# Patient Record
Sex: Female | Born: 1955 | Hispanic: No | Marital: Married | Smoking: Never smoker
Health system: Southern US, Community
[De-identification: ages and names within clinical notes are randomized; demographics above are authoritative.]

## PROBLEM LIST (undated history)

## (undated) DIAGNOSIS — F32A Depression, unspecified: Secondary | ICD-10-CM

## (undated) DIAGNOSIS — K52831 Collagenous colitis: Secondary | ICD-10-CM

## (undated) DIAGNOSIS — K589 Irritable bowel syndrome without diarrhea: Secondary | ICD-10-CM

## (undated) DIAGNOSIS — F329 Major depressive disorder, single episode, unspecified: Secondary | ICD-10-CM

## (undated) HISTORY — DX: Depression, unspecified: F32.A

## (undated) HISTORY — DX: Irritable bowel syndrome, unspecified: K58.9

## (undated) HISTORY — DX: Collagenous colitis: K52.831

## (undated) HISTORY — DX: Major depressive disorder, single episode, unspecified: F32.9

---

## 2002-12-21 ENCOUNTER — Other Ambulatory Visit: Admission: RE | Admit: 2002-12-21 | Discharge: 2002-12-21 | Payer: Self-pay | Admitting: Family Medicine

## 2003-02-13 ENCOUNTER — Encounter: Admission: RE | Admit: 2003-02-13 | Discharge: 2003-02-13 | Payer: Self-pay | Admitting: Family Medicine

## 2004-01-03 ENCOUNTER — Other Ambulatory Visit: Admission: RE | Admit: 2004-01-03 | Discharge: 2004-01-03 | Payer: Self-pay | Admitting: Family Medicine

## 2004-03-12 ENCOUNTER — Encounter: Admission: RE | Admit: 2004-03-12 | Discharge: 2004-03-12 | Payer: Self-pay | Admitting: Family Medicine

## 2004-12-29 ENCOUNTER — Ambulatory Visit: Payer: Self-pay | Admitting: Family Medicine

## 2005-01-05 ENCOUNTER — Encounter: Payer: Self-pay | Admitting: Family Medicine

## 2005-01-05 ENCOUNTER — Ambulatory Visit: Payer: Self-pay | Admitting: Family Medicine

## 2005-01-05 ENCOUNTER — Other Ambulatory Visit: Admission: RE | Admit: 2005-01-05 | Discharge: 2005-01-05 | Payer: Self-pay | Admitting: Family Medicine

## 2005-02-09 ENCOUNTER — Ambulatory Visit: Payer: Self-pay | Admitting: Family Medicine

## 2005-04-09 ENCOUNTER — Ambulatory Visit: Payer: Self-pay | Admitting: Family Medicine

## 2006-03-10 ENCOUNTER — Ambulatory Visit: Payer: Self-pay | Admitting: Family Medicine

## 2006-03-10 LAB — CONVERTED CEMR LAB
ALT: 31 units/L (ref 0–40)
AST: 34 units/L (ref 0–37)
Albumin: 3.2 g/dL — ABNORMAL LOW (ref 3.5–5.2)
Alkaline Phosphatase: 46 units/L (ref 39–117)
BUN: 10 mg/dL (ref 6–23)
Basophils Absolute: 0 10*3/uL (ref 0.0–0.1)
Basophils Relative: 0.2 % (ref 0.0–1.0)
CO2: 26 meq/L (ref 19–32)
Calcium: 9.2 mg/dL (ref 8.4–10.5)
Chloride: 108 meq/L (ref 96–112)
Chol/HDL Ratio, serum: 3.1
Cholesterol: 186 mg/dL (ref 0–200)
Creatinine, Ser: 1 mg/dL (ref 0.4–1.2)
Eosinophil percent: 1.3 % (ref 0.0–5.0)
GFR calc non Af Amer: 63 mL/min
Glomerular Filtration Rate, Af Am: 76 mL/min/{1.73_m2}
Glucose, Bld: 95 mg/dL (ref 70–99)
HCT: 36.1 % (ref 36.0–46.0)
HDL: 60.8 mg/dL (ref >39.0)
Hemoglobin: 11.8 g/dL — ABNORMAL LOW (ref 12.0–15.0)
LDL Cholesterol: 97 mg/dL (ref 0–99)
Lymphocytes Relative: 42 % (ref 12.0–46.0)
MCHC: 32.8 g/dL (ref 30.0–36.0)
MCV: 98.4 fL (ref 78.0–100.0)
Monocytes Absolute: 0.5 10*3/uL (ref 0.2–0.7)
Monocytes Relative: 6.3 % (ref 3.0–11.0)
Neutro Abs: 3.6 10*3/uL (ref 1.4–7.7)
Neutrophils Relative %: 50.2 % (ref 43.0–77.0)
Platelets: 311 10*3/uL (ref 150–400)
Potassium: 6.1 meq/L (ref 3.5–5.1)
RBC: 3.67 M/uL — ABNORMAL LOW (ref 3.87–5.11)
RDW: 12.5 % (ref 11.5–14.6)
Sodium: 141 meq/L (ref 135–145)
TSH: 1.53 microintl units/mL (ref 0.35–5.50)
Total Bilirubin: 0.9 mg/dL (ref 0.3–1.2)
Total Protein: 6.5 g/dL (ref 6.0–8.3)
Triglyceride fasting, serum: 143 mg/dL (ref 0–149)
VLDL: 29 mg/dL (ref 0–40)
WBC: 7.2 10*3/uL (ref 4.5–10.5)

## 2006-03-16 ENCOUNTER — Encounter: Payer: Self-pay | Admitting: Family Medicine

## 2006-03-16 ENCOUNTER — Ambulatory Visit: Payer: Self-pay | Admitting: Family Medicine

## 2006-03-16 ENCOUNTER — Other Ambulatory Visit: Admission: RE | Admit: 2006-03-16 | Discharge: 2006-03-16 | Payer: Self-pay | Admitting: Family Medicine

## 2006-04-14 ENCOUNTER — Encounter: Admission: RE | Admit: 2006-04-14 | Discharge: 2006-04-14 | Payer: Self-pay | Admitting: Family Medicine

## 2006-04-29 ENCOUNTER — Encounter: Admission: RE | Admit: 2006-04-29 | Discharge: 2006-04-29 | Payer: Self-pay | Admitting: Family Medicine

## 2006-10-12 ENCOUNTER — Encounter: Admission: RE | Admit: 2006-10-12 | Discharge: 2006-10-12 | Payer: Self-pay | Admitting: Family Medicine

## 2007-04-18 ENCOUNTER — Encounter: Admission: RE | Admit: 2007-04-18 | Discharge: 2007-04-18 | Payer: Self-pay | Admitting: Family Medicine

## 2007-05-12 ENCOUNTER — Telehealth: Payer: Self-pay | Admitting: Family Medicine

## 2007-05-24 ENCOUNTER — Ambulatory Visit: Payer: Self-pay | Admitting: Family Medicine

## 2007-05-24 LAB — CONVERTED CEMR LAB
ALT: 28 units/L (ref 0–35)
AST: 27 units/L (ref 0–37)
Albumin: 3.8 g/dL (ref 3.5–5.2)
Alkaline Phosphatase: 74 units/L (ref 39–117)
BUN: 11 mg/dL (ref 6–23)
Basophils Absolute: 0.1 10*3/uL (ref 0.0–0.1)
Basophils Relative: 1.1 % — ABNORMAL HIGH (ref 0.0–1.0)
Bilirubin Urine: NEGATIVE
Bilirubin, Direct: 0.1 mg/dL (ref 0.0–0.3)
CO2: 32 meq/L (ref 19–32)
Calcium: 9.1 mg/dL (ref 8.4–10.5)
Chloride: 106 meq/L (ref 96–112)
Cholesterol: 193 mg/dL (ref 0–200)
Creatinine, Ser: 0.8 mg/dL (ref 0.4–1.2)
Eosinophils Absolute: 0 10*3/uL (ref 0.0–0.6)
Eosinophils Relative: 0.8 % (ref 0.0–5.0)
GFR calc Af Amer: 98 mL/min
GFR calc non Af Amer: 81 mL/min
Glucose, Bld: 92 mg/dL (ref 70–99)
Glucose, Urine, Semiquant: NEGATIVE
HCT: 36.5 % (ref 36.0–46.0)
HDL: 69.7 mg/dL (ref >39.0)
Hemoglobin: 12.2 g/dL (ref 12.0–15.0)
Ketones, urine, test strip: NEGATIVE
LDL Cholesterol: 103 mg/dL — ABNORMAL HIGH (ref 0–99)
Lymphocytes Relative: 36.8 % (ref 12.0–46.0)
MCHC: 33.5 g/dL (ref 30.0–36.0)
MCV: 100.4 fL — ABNORMAL HIGH (ref 78.0–100.0)
Monocytes Absolute: 0.5 10*3/uL (ref 0.2–0.7)
Monocytes Relative: 8.5 % (ref 3.0–11.0)
Neutro Abs: 2.7 10*3/uL (ref 1.4–7.7)
Neutrophils Relative %: 52.8 % (ref 43.0–77.0)
Nitrite: NEGATIVE
Platelets: 260 10*3/uL (ref 150–400)
Potassium: 5 meq/L (ref 3.5–5.1)
Protein, U semiquant: NEGATIVE
RBC: 3.64 M/uL — ABNORMAL LOW (ref 3.87–5.11)
RDW: 13.2 % (ref 11.5–14.6)
Sodium: 143 meq/L (ref 135–145)
Specific Gravity, Urine: 1.015
TSH: 0.92 microintl units/mL (ref 0.35–5.50)
Total Bilirubin: 0.7 mg/dL (ref 0.3–1.2)
Total CHOL/HDL Ratio: 2.8
Total Protein: 6.5 g/dL (ref 6.0–8.3)
Triglycerides: 104 mg/dL (ref 0–149)
Urobilinogen, UA: 0.2
VLDL: 21 mg/dL (ref 0–40)
WBC Urine, dipstick: NEGATIVE
WBC: 5.3 10*3/uL (ref 4.5–10.5)
pH: 8

## 2007-05-31 ENCOUNTER — Other Ambulatory Visit: Admission: RE | Admit: 2007-05-31 | Discharge: 2007-05-31 | Payer: Self-pay | Admitting: Family Medicine

## 2007-05-31 ENCOUNTER — Encounter: Payer: Self-pay | Admitting: Family Medicine

## 2007-05-31 ENCOUNTER — Ambulatory Visit: Payer: Self-pay | Admitting: Family Medicine

## 2007-06-01 DIAGNOSIS — F329 Major depressive disorder, single episode, unspecified: Secondary | ICD-10-CM

## 2007-06-01 DIAGNOSIS — F32A Depression, unspecified: Secondary | ICD-10-CM | POA: Insufficient documentation

## 2007-06-09 ENCOUNTER — Ambulatory Visit: Payer: Self-pay | Admitting: Gastroenterology

## 2007-06-22 ENCOUNTER — Encounter: Payer: Self-pay | Admitting: Family Medicine

## 2007-06-22 ENCOUNTER — Ambulatory Visit: Payer: Self-pay | Admitting: Gastroenterology

## 2007-06-23 ENCOUNTER — Telehealth: Payer: Self-pay | Admitting: Family Medicine

## 2007-08-03 DIAGNOSIS — J309 Allergic rhinitis, unspecified: Secondary | ICD-10-CM | POA: Insufficient documentation

## 2007-08-09 ENCOUNTER — Ambulatory Visit: Payer: Self-pay | Admitting: Family Medicine

## 2007-10-05 ENCOUNTER — Ambulatory Visit: Payer: Self-pay | Admitting: Internal Medicine

## 2007-10-05 DIAGNOSIS — M65849 Other synovitis and tenosynovitis, unspecified hand: Secondary | ICD-10-CM

## 2007-10-05 DIAGNOSIS — M65839 Other synovitis and tenosynovitis, unspecified forearm: Secondary | ICD-10-CM

## 2008-05-03 ENCOUNTER — Telehealth: Payer: Self-pay | Admitting: Family Medicine

## 2008-07-16 ENCOUNTER — Ambulatory Visit: Payer: Self-pay | Admitting: Family Medicine

## 2008-07-16 LAB — CONVERTED CEMR LAB
ALT: 29 units/L (ref 0–35)
AST: 29 units/L (ref 0–37)
Albumin: 3.6 g/dL (ref 3.5–5.2)
Alkaline Phosphatase: 62 units/L (ref 39–117)
BUN: 13 mg/dL (ref 6–23)
Basophils Absolute: 0 10*3/uL (ref 0.0–0.1)
Basophils Relative: 0.7 % (ref 0.0–3.0)
Bilirubin Urine: NEGATIVE
Bilirubin, Direct: 0.1 mg/dL (ref 0.0–0.3)
CO2: 28 meq/L (ref 19–32)
Calcium: 8.7 mg/dL (ref 8.4–10.5)
Chloride: 109 meq/L (ref 96–112)
Cholesterol: 194 mg/dL (ref 0–200)
Creatinine, Ser: 0.7 mg/dL (ref 0.4–1.2)
Eosinophils Absolute: 0.8 10*3/uL — ABNORMAL HIGH (ref 0.0–0.7)
Eosinophils Relative: 12.7 % — ABNORMAL HIGH (ref 0.0–5.0)
GFR calc non Af Amer: 93.45 mL/min (ref >60)
Glucose, Bld: 82 mg/dL (ref 70–99)
Glucose, Urine, Semiquant: NEGATIVE
HCT: 37.6 % (ref 36.0–46.0)
HDL: 72.1 mg/dL (ref >39.00)
Hemoglobin: 12.9 g/dL (ref 12.0–15.0)
Ketones, urine, test strip: NEGATIVE
LDL Cholesterol: 109 mg/dL — ABNORMAL HIGH (ref 0–99)
Lymphocytes Relative: 30.1 % (ref 12.0–46.0)
Lymphs Abs: 1.8 10*3/uL (ref 0.7–4.0)
MCHC: 34.4 g/dL (ref 30.0–36.0)
MCV: 101.2 fL — ABNORMAL HIGH (ref 78.0–100.0)
Monocytes Absolute: 0.4 10*3/uL (ref 0.1–1.0)
Monocytes Relative: 7.1 % (ref 3.0–12.0)
Neutro Abs: 3.1 10*3/uL (ref 1.4–7.7)
Neutrophils Relative %: 49.4 % (ref 43.0–77.0)
Nitrite: NEGATIVE
Platelets: 214 10*3/uL (ref 150.0–400.0)
Potassium: 3.9 meq/L (ref 3.5–5.1)
Protein, U semiquant: NEGATIVE
RBC: 3.72 M/uL — ABNORMAL LOW (ref 3.87–5.11)
RDW: 13.3 % (ref 11.5–14.6)
Sodium: 143 meq/L (ref 135–145)
Specific Gravity, Urine: 1.015
TSH: 1.25 microintl units/mL (ref 0.35–5.50)
Total Bilirubin: 0.6 mg/dL (ref 0.3–1.2)
Total CHOL/HDL Ratio: 3
Total Protein: 6.4 g/dL (ref 6.0–8.3)
Triglycerides: 64 mg/dL (ref 0.0–149.0)
Urobilinogen, UA: 0.2
VLDL: 12.8 mg/dL (ref 0.0–40.0)
WBC Urine, dipstick: NEGATIVE
WBC: 6.1 10*3/uL (ref 4.5–10.5)
pH: 7.5

## 2008-07-25 ENCOUNTER — Ambulatory Visit: Payer: Self-pay | Admitting: Family Medicine

## 2008-12-28 ENCOUNTER — Telehealth: Payer: Self-pay | Admitting: Family Medicine

## 2009-01-02 ENCOUNTER — Ambulatory Visit: Payer: Self-pay | Admitting: Family Medicine

## 2009-01-02 DIAGNOSIS — L02419 Cutaneous abscess of limb, unspecified: Secondary | ICD-10-CM | POA: Insufficient documentation

## 2009-01-02 DIAGNOSIS — L03119 Cellulitis of unspecified part of limb: Secondary | ICD-10-CM

## 2009-07-19 ENCOUNTER — Ambulatory Visit: Payer: Self-pay | Admitting: Family Medicine

## 2009-07-19 LAB — CONVERTED CEMR LAB
ALT: 32 units/L (ref 0–35)
AST: 43 units/L — ABNORMAL HIGH (ref 0–37)
Albumin: 4.2 g/dL (ref 3.5–5.2)
Alkaline Phosphatase: 76 units/L (ref 39–117)
BUN: 13 mg/dL (ref 6–23)
Basophils Absolute: 0 10*3/uL (ref 0.0–0.1)
Basophils Relative: 0.5 % (ref 0.0–3.0)
Bilirubin Urine: NEGATIVE
Bilirubin, Direct: 0.1 mg/dL (ref 0.0–0.3)
Blood in Urine, dipstick: NEGATIVE
CO2: 25 meq/L (ref 19–32)
Calcium: 9.3 mg/dL (ref 8.4–10.5)
Chloride: 105 meq/L (ref 96–112)
Cholesterol: 207 mg/dL — ABNORMAL HIGH (ref 0–200)
Creatinine, Ser: 0.8 mg/dL (ref 0.4–1.2)
Direct LDL: 116.1 mg/dL
Eosinophils Absolute: 0.1 10*3/uL (ref 0.0–0.7)
Eosinophils Relative: 1.6 % (ref 0.0–5.0)
GFR calc non Af Amer: 80.96 mL/min (ref >60)
Glucose, Bld: 82 mg/dL (ref 70–99)
Glucose, Urine, Semiquant: NEGATIVE
HCT: 35.3 % — ABNORMAL LOW (ref 36.0–46.0)
HDL: 77.1 mg/dL (ref >39.00)
Hemoglobin: 12.1 g/dL (ref 12.0–15.0)
Ketones, urine, test strip: NEGATIVE
Lymphocytes Relative: 48.4 % — ABNORMAL HIGH (ref 12.0–46.0)
Lymphs Abs: 2.2 10*3/uL (ref 0.7–4.0)
MCHC: 34.5 g/dL (ref 30.0–36.0)
MCV: 101.6 fL — ABNORMAL HIGH (ref 78.0–100.0)
Monocytes Absolute: 0.4 10*3/uL (ref 0.1–1.0)
Monocytes Relative: 8.9 % (ref 3.0–12.0)
Neutro Abs: 1.9 10*3/uL (ref 1.4–7.7)
Neutrophils Relative %: 40.6 % — ABNORMAL LOW (ref 43.0–77.0)
Nitrite: NEGATIVE
Platelets: 221 10*3/uL (ref 150.0–400.0)
Potassium: 3.9 meq/L (ref 3.5–5.1)
Protein, U semiquant: NEGATIVE
RBC: 3.47 M/uL — ABNORMAL LOW (ref 3.87–5.11)
RDW: 13.4 % (ref 11.5–14.6)
Sodium: 140 meq/L (ref 135–145)
Specific Gravity, Urine: 1.015
TSH: 1.68 microintl units/mL (ref 0.35–5.50)
Total Bilirubin: 0.8 mg/dL (ref 0.3–1.2)
Total CHOL/HDL Ratio: 3
Total Protein: 6.8 g/dL (ref 6.0–8.3)
Triglycerides: 69 mg/dL (ref 0.0–149.0)
Urobilinogen, UA: 0.2
VLDL: 13.8 mg/dL (ref 0.0–40.0)
WBC: 4.6 10*3/uL (ref 4.5–10.5)
pH: 5.5

## 2009-07-25 ENCOUNTER — Other Ambulatory Visit: Admission: RE | Admit: 2009-07-25 | Discharge: 2009-07-25 | Payer: Self-pay | Admitting: Family Medicine

## 2009-07-25 ENCOUNTER — Ambulatory Visit: Payer: Self-pay | Admitting: Family Medicine

## 2009-07-25 DIAGNOSIS — N951 Menopausal and female climacteric states: Secondary | ICD-10-CM

## 2009-07-25 LAB — HM PAP SMEAR

## 2009-07-31 ENCOUNTER — Encounter: Admission: RE | Admit: 2009-07-31 | Discharge: 2009-07-31 | Payer: Self-pay | Admitting: Family Medicine

## 2009-07-31 LAB — HM MAMMOGRAPHY

## 2009-08-23 ENCOUNTER — Telehealth: Payer: Self-pay | Admitting: Family Medicine

## 2010-02-05 ENCOUNTER — Encounter: Payer: Self-pay | Admitting: Family Medicine

## 2010-03-30 ENCOUNTER — Encounter: Payer: Self-pay | Admitting: Family Medicine

## 2010-04-06 LAB — CONVERTED CEMR LAB: Pap Smear: NEGATIVE

## 2010-04-08 NOTE — Medication Information (Signed)
Summary: Prior Authorization and Approval for Zolpidem Tartrate  Prior Authorization and Approval for Zolpidem Tartrate   Imported By: Maryln Gottron 02/07/2010 15:11:17  _____________________________________________________________________  External Attachment:    Type:   Image     Comment:   External Document

## 2010-04-08 NOTE — Progress Notes (Signed)
Summary: Hormone problems    Caller: Patient Call For: Roderick Pee MD Summary of Call: Pt is getting full periods on Prempro.  It has helped with menopausal symptoms, and has gained 5 lbs.  Wants to know if this is normal to have full blown periods. [phone] Initial call taken by: Lynann Beaver CMA,  August 23, 2009 11:19 AM    Additional Follow-up for Phone Call Additional follow up Details #2::    disinsert 29, standing on the Prempro she feels much better, but now is spotting.  Advised to take one pill twice a day for two days, then go back to one a day.  Call p.r.n. Follow-up by: Roderick Pee MD,  August 23, 2009 1:43 PM

## 2010-04-08 NOTE — Assessment & Plan Note (Signed)
Summary: cpx/pap/njr rsc bmp/njr   Vital Signs:  Patient profile:   55 year old female Menstrual status:  regular LMP:     02/06/2009 Height:      68 inches Weight:      160 pounds BMI:     24.42 Temp:     98.2 degrees F oral BP sitting:   108 / 68  (left arm) Cuff size:   regular  Vitals Entered By: Kern Reap CMA Duncan Dull) (Jul 25, 2009 9:06 AM) CC: cpx LMP (date): 02/06/2009     Enter LMP: 02/06/2009   CC:  cpx.  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  Because of working, nights she's having difficulty sleeping.  We will discuss options.  Her LMP was December since that time.  She is having trouble with hot flashes dry.  Skin dry vagina sleep dysfunction.  She is taking birth control pills in the past with no side effects.  Tetanus 2006 seasonal flu 2010  Allergies: No Known Drug Allergies  Past History:  Past medical, surgical, family and social histories (including risk factors) reviewed, and no changes noted (except as noted below).  Past Medical History: Reviewed history from 05/31/2007 and no changes required. cb x1  Depression IBS  Family History: Reviewed history from 05/31/2007 and no changes required. father died at 44 an MRI he had bypass at age 32.  He was a smoker mother 9 had degenerative joint disease and hypertension otherwise in good health one brother in good health.  A sister with lupus.  Originally from [place], [place].  Social History: Reviewed history from 07/25/2008 and no changes required. Occupation Water engineer, [employer] Never Smoked Alcohol use-no Drug use-no Regular exercise-yes  Review of Systems      See  HPI  Physical Exam  General:  Well-developed,well-nourished,in no acute distress; alert,appropriate and cooperative throughout examination Head:  Normocephalic and atraumatic without obvious abnormalities. No apparent alopecia or balding. Eyes:  No corneal or conjunctival inflammation noted. EOMI. Perrla. Funduscopic exam benign, without hemorrhages, exudates or papilledema. Vision grossly normal. Ears:  External ear exam shows no significant lesions or deformities.  Otoscopic examination reveals clear canals, tympanic membranes are intact bilaterally without bulging, retraction, inflammation or discharge. Hearing is grossly normal bilaterally. Nose:  External nasal examination shows no deformity or inflammation. Nasal mucosa are pink and moist without lesions or exudates. Mouth:  Oral mucosa and oropharynx without lesions or exudates.  Teeth in good repair. Neck:  No deformities, masses, or tenderness noted. Chest Wall:  No deformities, masses, or tenderness noted. Breasts:  diffuse fibrocystic changes Lungs:  Normal respiratory effort, chest expands symmetrically. Lungs are clear to auscultation, no crackles or wheezes. Heart:  Normal rate and regular rhythm. S1 and S2 normal without gallop, murmur, click, rub or other extra sounds. Abdomen:  Bowel sounds positive,abdomen soft and non-tender without masses, organomegaly or hernias noted. Rectal:  No external abnormalities noted. Normal sphincter tone. No rectal masses or tenderness. Genitalia:  Pelvic Exam:        External: normal female genitalia without lesions or masses        Vagina: normal without lesions or masses  Cervix: normal without lesions or masses        Adnexa: normal bimanual exam without masses or fullness        Uterus: normal by palpation        Pap smear: performed Msk:  No deformity or scoliosis noted of thoracic or lumbar spine.   Pulses:  R and L carotid,radial,femoral,dorsalis pedis and posterior tibial pulses  are full and equal bilaterally Extremities:  No clubbing, cyanosis, edema, or deformity noted with normal full range of motion of all joints.   Neurologic:  No cranial nerve deficits noted. Station and gait are normal. Plantar reflexes are down-going bilaterally. DTRs are symmetrical throughout. Sensory, motor and coordinative functions appear intact. Skin:  Intact without suspicious lesions or rashes Cervical Nodes:  No lymphadenopathy noted Axillary Nodes:  No palpable lymphadenopathy Inguinal Nodes:  No significant adenopathy Psych:  Cognition and judgment appear intact. Alert and cooperative with normal attention span and concentration. No apparent delusions, illusions, hallucinations   Impression & Recommendations:  Problem # 1:  HEALTH MAINTENANCE EXAM (ICD-V70.0) Assessment Unchanged  Orders: Prescription Created Electronically [id number]) EKG w/ Interpretation (93000)  Problem # 2:  DEPRESSION (ICD-311) Assessment: Improved  Her updated medication list for this problem includes:    Citalopram Hydrobromide 20 Mg Tabs (Citalopram hydrobromide) ..... One and 1/2  by mouth q hs  Orders: Prescription Created Electronically [phone])  Problem # 3:  MENOPAUSE-RELATED VASOMOTOR SYMPTOMS, HOT FLASHES (ICD-627.2) Assessment: New  Her updated medication list for this problem includes:    Prempro 0.3-1.5 Mg Tabs (Conj estrog-medroxyprogest ace) .Marland Kitchen... 1 tab @ bedtime  Orders: Prescription Created Electronically [id number])  Complete Medication List: 1)  Citalopram Hydrobromide 20 Mg Tabs (Citalopram hydrobromide) .... One and 1/2  by mouth q hs 2)  Prempro 0.3-1.5 Mg Tabs (Conj estrog-medroxyprogest ace) .Marland Kitchen.. 1 tab @ bedtime 3)  Ambien 5 Mg Tabs (Zolpidem tartrate) .Marland Kitchen.. 1 tab @ bedtime as needed  Patient Instructions: 1)  take Ambien 5 mg dose one half tablet p.r.n. for sleep .  Also begin Prempro .3, one daily, take one aspirin tablet with it.  If after one month y r  feeling well,   Continue that medication,  if not call. 2)  Please schedule a follow-up appointment in 1 year. 3)  Schedule your mammogram. 4)  Take an Aspirin every day. Prescriptions: PREMPRO 0.3-1.5 MG TABS (CONJ ESTROG-MEDROXYPROGEST ACE) 1 tab @ bedtime  #100 x 3   Entered and Authorized by:   Roderick Pee MD   Signed by:   Roderick Pee MD on 07/25/2009   Method used:   Electronically to        [facility]. [phone]* (retail)       [phone]       [place], [place]  [address]       Ph: [phone]       Fax: [phone]   RxID:   [id number] CITALOPRAM HYDROBROMIDE 20 MG  TABS (CITALOPRAM HYDROBROMIDE) one and 1/2  by mouth q hs  #150 x 3   Entered and Authorized by:   Roderick Pee MD   Signed by:   Roderick Pee MD on 07/25/2009   Method used:   Electronically to        [facility]. [phone]* (retail)       [phone] [address]       [facility]  [place], [place]  [address]       Ph: [phone]       Fax: [phone]   RxID:   [id number] AMBIEN 5 MG TABS (ZOLPIDEM TARTRATE) 1 tab @ bedtime as needed  #30 x 3   Entered and Authorized by:   Roderick Pee MD   Signed by:   Roderick Pee MD on 07/25/2009   Method used:   Print then Give to Patient   RxID:   [id number] PREMPRO 0.3-1.5 MG TABS (CONJ ESTROG-MEDROXYPROGEST ACE) 1 tab @ bedtime  #100 x 3   Entered and Authorized by:   Roderick Pee MD   Signed by:   Roderick Pee MD on 07/25/2009   Method used:   Electronically to        [facility]. [phone]* (retail)       [phone] [address]       [place], [place]  [address]       Ph: [phone]       Fax: [phone]   RxID:   [id number]

## 2010-07-27 ENCOUNTER — Other Ambulatory Visit: Payer: Self-pay | Admitting: Family Medicine

## 2010-08-14 ENCOUNTER — Other Ambulatory Visit: Payer: Self-pay | Admitting: Family Medicine

## 2010-08-21 ENCOUNTER — Other Ambulatory Visit: Payer: Self-pay | Admitting: Family Medicine

## 2010-08-21 DIAGNOSIS — Z1231 Encounter for screening mammogram for malignant neoplasm of breast: Secondary | ICD-10-CM

## 2010-08-28 ENCOUNTER — Ambulatory Visit
Admission: RE | Admit: 2010-08-28 | Discharge: 2010-08-28 | Disposition: A | Discharge status: Home / Self Care | Payer: 59 | Source: Ambulatory Visit | Attending: Family Medicine | Admitting: Family Medicine

## 2010-08-28 DIAGNOSIS — Z1231 Encounter for screening mammogram for malignant neoplasm of breast: Secondary | ICD-10-CM

## 2010-09-03 ENCOUNTER — Other Ambulatory Visit (INDEPENDENT_AMBULATORY_CARE_PROVIDER_SITE_OTHER): Payer: 59

## 2010-09-03 DIAGNOSIS — Z0000 Encounter for general adult medical examination without abnormal findings: Secondary | ICD-10-CM

## 2010-09-03 LAB — BASIC METABOLIC PANEL
BUN: 14 mg/dL (ref 6–23)
CO2: 30 mEq/L (ref 19–32)
Calcium: 8.8 mg/dL (ref 8.4–10.5)
Chloride: 108 mEq/L (ref 96–112)
Creatinine, Ser: 0.8 mg/dL (ref 0.4–1.2)
GFR: 84.3 mL/min (ref >60.00)
Glucose, Bld: 89 mg/dL (ref 70–99)
Potassium: 5.2 mEq/L — ABNORMAL HIGH (ref 3.5–5.1)
Sodium: 145 mEq/L (ref 135–145)

## 2010-09-03 LAB — POCT URINALYSIS DIPSTICK
Bilirubin, UA: NEGATIVE
Blood, UA: NEGATIVE
Glucose, UA: NEGATIVE
Ketones, UA: NEGATIVE
Nitrite, UA: POSITIVE
Protein, UA: NEGATIVE
Spec Grav, UA: 1.015
Urobilinogen, UA: 0.2
pH, UA: 7.5

## 2010-09-03 LAB — CBC WITH DIFFERENTIAL/PLATELET
Basophils Absolute: 0 10*3/uL (ref 0.0–0.1)
Basophils Relative: 0.6 % (ref 0.0–3.0)
Eosinophils Absolute: 0.1 10*3/uL (ref 0.0–0.7)
Eosinophils Relative: 1.3 % (ref 0.0–5.0)
HCT: 35.5 % — ABNORMAL LOW (ref 36.0–46.0)
Hemoglobin: 12.1 g/dL (ref 12.0–15.0)
Lymphocytes Relative: 46.3 % — ABNORMAL HIGH (ref 12.0–46.0)
Lymphs Abs: 2.1 10*3/uL (ref 0.7–4.0)
MCHC: 33.9 g/dL (ref 30.0–36.0)
MCV: 101.7 fl — ABNORMAL HIGH (ref 78.0–100.0)
Monocytes Absolute: 0.4 10*3/uL (ref 0.1–1.0)
Monocytes Relative: 8.7 % (ref 3.0–12.0)
Neutro Abs: 2 10*3/uL (ref 1.4–7.7)
Neutrophils Relative %: 43.1 % (ref 43.0–77.0)
Platelets: 225 10*3/uL (ref 150.0–400.0)
RBC: 3.49 Mil/uL — ABNORMAL LOW (ref 3.87–5.11)
RDW: 13.5 % (ref 11.5–14.6)
WBC: 4.6 10*3/uL (ref 4.5–10.5)

## 2010-09-03 LAB — LIPID PANEL
Cholesterol: 184 mg/dL (ref 0–200)
HDL: 76.8 mg/dL (ref >39.00)
LDL Cholesterol: 96 mg/dL (ref 0–99)
Total CHOL/HDL Ratio: 2
Triglycerides: 57 mg/dL (ref 0.0–149.0)
VLDL: 11.4 mg/dL (ref 0.0–40.0)

## 2010-09-03 LAB — HEPATIC FUNCTION PANEL
ALT: 23 U/L (ref 0–35)
AST: 29 U/L (ref 0–37)
Albumin: 3.9 g/dL (ref 3.5–5.2)
Alkaline Phosphatase: 60 U/L (ref 39–117)
Bilirubin, Direct: 0.1 mg/dL (ref 0.0–0.3)
Total Bilirubin: 0.6 mg/dL (ref 0.3–1.2)
Total Protein: 6.3 g/dL (ref 6.0–8.3)

## 2010-09-03 LAB — TSH: TSH: 1.36 u[IU]/mL (ref 0.35–5.50)

## 2010-09-05 ENCOUNTER — Other Ambulatory Visit: Payer: Self-pay | Admitting: Family Medicine

## 2010-09-11 ENCOUNTER — Encounter: Payer: Self-pay | Admitting: Family Medicine

## 2010-09-16 ENCOUNTER — Other Ambulatory Visit (HOSPITAL_COMMUNITY)
Admission: RE | Admit: 2010-09-16 | Discharge: 2010-09-16 | Disposition: A | Discharge status: Home / Self Care | Payer: 59 | Source: Ambulatory Visit | Attending: Family Medicine | Admitting: Family Medicine

## 2010-09-16 ENCOUNTER — Encounter: Payer: Self-pay | Admitting: Family Medicine

## 2010-09-16 ENCOUNTER — Ambulatory Visit (INDEPENDENT_AMBULATORY_CARE_PROVIDER_SITE_OTHER): Payer: 59 | Admitting: Family Medicine

## 2010-09-16 DIAGNOSIS — N951 Menopausal and female climacteric states: Secondary | ICD-10-CM

## 2010-09-16 DIAGNOSIS — F329 Major depressive disorder, single episode, unspecified: Secondary | ICD-10-CM

## 2010-09-16 DIAGNOSIS — Z01419 Encounter for gynecological examination (general) (routine) without abnormal findings: Secondary | ICD-10-CM | POA: Insufficient documentation

## 2010-09-16 MED ORDER — CITALOPRAM HYDROBROMIDE 20 MG PO TABS: ORAL_TABLET | ORAL | Status: DC

## 2010-09-16 MED ORDER — CONJ ESTROG-MEDROXYPROGEST ACE 0.3-1.5 MG PO TABS: 1.0000 | ORAL_TABLET | Freq: Every day | ORAL | Status: DC

## 2010-09-16 MED ORDER — ZOLPIDEM TARTRATE 5 MG PO TABS: 5.0000 mg | ORAL_TABLET | Freq: Every evening | ORAL | Status: DC | PRN

## 2010-09-16 NOTE — Progress Notes (Signed)
  Subjective:    Patient ID: Rebecca Hart, female    DOB: [birth year], 55 y.o.   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  She has postmenopausal symptoms and is on Prempro one daily, asymptomatic on medication.  She also uses Ambien 5 mg p.r.n. Because of sleep dysfunction from her work at the hospital.  She gets routine eye care, dental care, BSE monthly, and you mammography, colonoscopy, normal.  Tetanus 2006.   Review of Systems  Constitutional: Negative.   HENT: Negative.   Eyes: Negative.   Respiratory: Negative.   Cardiovascular: Negative.   Gastrointestinal: Negative.   Genitourinary: Negative.   Musculoskeletal: Negative.   Neurological: Negative.   Hematological: Negative.   Psychiatric/Behavioral: Negative.        Objective:   Physical Exam  Constitutional: She appears well-developed and well-nourished.  HENT:  Head: Normocephalic and atraumatic.  Right Ear: External ear normal.  Left Ear: External ear normal.  Nose: Nose normal.  Mouth/Throat: Oropharynx is clear and moist.  Eyes: EOM are normal. Pupils are equal, round, and reactive to light.  Neck: Normal range of motion. Neck supple. No thyromegaly present.  Cardiovascular: Normal rate, regular rhythm, normal heart sounds and intact distal pulses.  Exam reveals no gallop and no friction rub.   No murmur heard. Pulmonary/Chest: Effort normal and breath sounds normal.  Abdominal: Soft. Bowel sounds are normal. She exhibits no distension and no mass. There is no tenderness. There is no rebound.  Genitourinary: Vagina normal and uterus normal. Guaiac negative stool. No vaginal discharge found.       Bilateral breast exam normal  Musculoskeletal: Normal range of motion.  Lymphadenopathy:    She has  no cervical adenopathy.  Neurological: She is alert. She has normal reflexes. No cranial nerve deficit. She exhibits normal muscle tone. Coordination normal.  Skin: Skin is warm and dry.  Psychiatric: She has a normal mood and affect. Her behavior is normal. Judgment and thought content normal.          Assessment & Plan:  Healthy female.  Mild depression continue Celexa 30 mg daily.  Postmenopausal with symptomatic flushes continue Prempro one daily.  Sleep dysfunction.  Ambien p.r.n.

## 2010-09-16 NOTE — Patient Instructions (Signed)
Continue current medications follow-up in one year, sooner if any problems.  Remember to take calcium and vitamin D on a daily basis and an 81-mg baby aspirin

## 2010-10-17 ENCOUNTER — Other Ambulatory Visit: Payer: Self-pay | Admitting: Family Medicine

## 2010-10-20 ENCOUNTER — Other Ambulatory Visit: Payer: Self-pay | Admitting: *Deleted

## 2010-10-20 DIAGNOSIS — N951 Menopausal and female climacteric states: Secondary | ICD-10-CM

## 2010-10-20 MED ORDER — CONJ ESTROG-MEDROXYPROGEST ACE 0.3-1.5 MG PO TABS: 1.0000 | ORAL_TABLET | Freq: Every day | ORAL | Status: DC

## 2011-03-19 ENCOUNTER — Other Ambulatory Visit: Payer: Self-pay | Admitting: *Deleted

## 2011-03-19 DIAGNOSIS — F329 Major depressive disorder, single episode, unspecified: Secondary | ICD-10-CM

## 2011-03-19 MED ORDER — ZOLPIDEM TARTRATE 5 MG PO TABS: 5.0000 mg | ORAL_TABLET | Freq: Every evening | ORAL | Status: DC | PRN

## 2011-07-06 ENCOUNTER — Encounter: Payer: Self-pay | Admitting: Family

## 2011-07-06 ENCOUNTER — Ambulatory Visit (INDEPENDENT_AMBULATORY_CARE_PROVIDER_SITE_OTHER): Payer: 59 | Admitting: Family

## 2011-07-06 VITALS — BP 98/60 | Temp 98.7°F | Wt 159.0 lb

## 2011-07-06 DIAGNOSIS — J0100 Acute maxillary sinusitis, unspecified: Secondary | ICD-10-CM

## 2011-07-06 DIAGNOSIS — R05 Cough: Secondary | ICD-10-CM

## 2011-07-06 MED ORDER — PREDNISONE 20 MG PO TABS: ORAL_TABLET | ORAL | Status: AC

## 2011-07-06 NOTE — Patient Instructions (Signed)

## 2011-07-06 NOTE — Progress Notes (Signed)
  Subjective:    Patient ID: Rebecca Hart, female    DOB: 1955-04-01, 56 y.o.   MRN: [id number]  HPI 56 year old white female, nonsmoker, patient of Dr. Dora Sims does have complaints of cough that's nonproductive, congestion, sinus pressure and pain, postnasal drip x6 days. She's been taken over-the-counter Zyrtec, Sudafed, Tylenol cold and flu, and NyQuil with no relief. Denies lightheadedness, dizziness, chest pain, palpitations, shortness of breath or edema.   Review of Systems  Constitutional: Negative.   HENT: Positive for congestion, sore throat, rhinorrhea, sneezing, postnasal drip and sinus pressure.   Respiratory: Negative.   Cardiovascular: Negative.   Gastrointestinal: Negative.   Genitourinary: Negative.   Musculoskeletal: Negative.   Skin: Negative.   Neurological: Negative.   Hematological: Negative.   Psychiatric/Behavioral: Negative.    Past Medical History  Diagnosis Date  . Depression   . IBS (irritable bowel syndrome)     History   Social History  . Marital Status: Married    Spouse Name: N/A    Number of Children: N/A  . Years of Education: N/A   Occupational History  . Not on file.   Social History Main Topics  . Smoking status: Never Smoker   . Smokeless tobacco: Not on file  . Alcohol Use: No  . Drug Use: No  . Sexually Active:    Other Topics Concern  . Not on file   Social History Narrative  . No narrative on file    No past surgical history on file.  Family History  Problem Relation Age of Onset  . Hypertension Mother   . Lupus Sister     No Known Allergies  Current Outpatient Prescriptions on File Prior to Visit  Medication Sig Dispense Refill  . citalopram (CELEXA) 20 MG tablet 1 1/2 tabs nightly  150 tablet  3  . estrogen, conjugated,-medroxyprogesterone (PREMPRO) 0.3-1.5 MG per tablet Take 1 tablet by mouth daily.  84 tablet  3  . zolpidem (AMBIEN) 5 MG tablet Take 1 tablet (5 mg total) by mouth at bedtime as needed.  30  tablet  5    BP 98/60  Temp(Src) 98.7 F (37.1 C) (Oral)  Wt 159 lb (72.122 kg)chart    Objective:   Physical Exam  Constitutional: She is oriented to person, place, and time. She appears well-developed and well-nourished.  HENT:  Right Ear: External ear normal.  Left Ear: External ear normal.  Nose: Nose normal.  Mouth/Throat: Oropharynx is clear and moist.  Neck: Normal range of motion. Neck supple.  Cardiovascular: Normal rate, regular rhythm and normal heart sounds.   Pulmonary/Chest: Effort normal and breath sounds normal.  Neurological: She is alert and oriented to person, place, and time.  Skin: Skin is warm and dry.  Psychiatric: She has a normal mood and affect.          Assessment & Plan:  Assessment: Acute sinusitis, allergic rhinitis  Plan: Prednisone 60 mg by mouth q. a.m. x3 days 40 mg by mouth every morning x3 days, 20 mg by mouth every morning x3 days

## 2011-08-04 ENCOUNTER — Encounter: Payer: Self-pay | Admitting: Family Medicine

## 2011-08-04 ENCOUNTER — Ambulatory Visit (INDEPENDENT_AMBULATORY_CARE_PROVIDER_SITE_OTHER): Payer: 59 | Admitting: Family Medicine

## 2011-08-04 ENCOUNTER — Telehealth: Payer: Self-pay | Admitting: Family Medicine

## 2011-08-04 ENCOUNTER — Other Ambulatory Visit (HOSPITAL_COMMUNITY)
Admission: RE | Admit: 2011-08-04 | Discharge: 2011-08-04 | Disposition: A | Discharge status: Home / Self Care | Payer: 59 | Source: Ambulatory Visit | Attending: Family Medicine | Admitting: Family Medicine

## 2011-08-04 VITALS — BP 120/80 | Temp 98.2°F | Wt 160.0 lb

## 2011-08-04 DIAGNOSIS — Z0000 Encounter for general adult medical examination without abnormal findings: Secondary | ICD-10-CM

## 2011-08-04 DIAGNOSIS — N950 Postmenopausal bleeding: Secondary | ICD-10-CM | POA: Insufficient documentation

## 2011-08-04 DIAGNOSIS — Z01419 Encounter for gynecological examination (general) (routine) without abnormal findings: Secondary | ICD-10-CM | POA: Insufficient documentation

## 2011-08-04 DIAGNOSIS — N951 Menopausal and female climacteric states: Secondary | ICD-10-CM

## 2011-08-04 DIAGNOSIS — F329 Major depressive disorder, single episode, unspecified: Secondary | ICD-10-CM

## 2011-08-04 DIAGNOSIS — N946 Dysmenorrhea, unspecified: Secondary | ICD-10-CM

## 2011-08-04 MED ORDER — CONJ ESTROG-MEDROXYPROGEST ACE 0.3-1.5 MG PO TABS: 1.0000 | ORAL_TABLET | Freq: Every day | ORAL | Status: DC

## 2011-08-04 MED ORDER — ZOLPIDEM TARTRATE 5 MG PO TABS: 5.0000 mg | ORAL_TABLET | Freq: Every evening | ORAL | Status: DC | PRN

## 2011-08-04 MED ORDER — CITALOPRAM HYDROBROMIDE 20 MG PO TABS: ORAL_TABLET | ORAL | Status: DC

## 2011-08-04 NOTE — Patient Instructions (Signed)
Take one Prempro tablet twice daily until the bleeding stops then go back and take one daily  Followup in 3 weeks sooner if any problems

## 2011-08-04 NOTE — Progress Notes (Signed)
  Subjective:    Patient ID: Rebecca Hart, female    DOB: October 25, 1955, 56 y.o.   MRN: [id number]  HPI Rebecca Hart is a 56 year old married female nonsmoker who comes in today for evaluation of postmenopausal bleeding  She had her last normal. 2-1/2 years ago. Because of severe hot flashes we put her on Prempro which she has taken the 0.3-1.5 tablet daily. Last Thursday she noticed some spotting. Otherwise asymptomatic. It was around the time of her daughter's graduation from [employer] and she can't remember she did skip a pill or 2  Review of systems otherwise negative   Review of Systems  Constitutional: Negative.   HENT: Negative.   Eyes: Negative.   Respiratory: Negative.   Cardiovascular: Negative.   Gastrointestinal: Negative.   Genitourinary: Negative.   Musculoskeletal: Negative.   Neurological: Negative.   Hematological: Negative.   Psychiatric/Behavioral: Negative.        Objective:   Physical Exam  Constitutional: She appears well-developed and well-nourished.  HENT:  Head: Normocephalic and atraumatic.  Right Ear: External ear normal.  Left Ear: External ear normal.  Nose: Nose normal.  Mouth/Throat: Oropharynx is clear and moist.  Eyes: EOM are normal. Pupils are equal, round, and reactive to light.  Neck: Normal range of motion. Neck supple. No thyromegaly present.  Cardiovascular: Normal rate, regular rhythm, normal heart sounds and intact distal pulses.  Exam reveals no gallop and no friction rub.   No murmur heard. Pulmonary/Chest: Effort normal and breath sounds normal.  Abdominal: Soft. Bowel sounds are normal. She exhibits no distension and no mass. There is no tenderness. There is no rebound.  Genitourinary: Vagina normal and uterus normal. Guaiac negative stool. No vaginal discharge found.  Musculoskeletal: Normal range of motion.  Lymphadenopathy:    She has no cervical adenopathy.  Neurological: She is alert. She has normal reflexes. No cranial nerve  deficit. She exhibits normal muscle tone. Coordination normal.  Skin: Skin is warm and dry.  Psychiatric: She has a normal mood and affect. Her behavior is normal. Judgment and thought content normal.          Assessment & Plan:  Healthy female  Breakthrough bleeding probably secondary to missing a dose or 2 we'll double the dose to get her to stop in in followup to be sure the bleeding stops completely

## 2011-08-04 NOTE — Telephone Encounter (Signed)
Scheduled

## 2011-08-04 NOTE — Telephone Encounter (Signed)
Pt having vaginal bleeding for 6 days and it is not stopping. Pt is already on menopause. Pt req work in ov with Dr Tawanna Cooler.

## 2011-08-19 ENCOUNTER — Other Ambulatory Visit: Payer: Self-pay | Admitting: Family Medicine

## 2011-08-19 DIAGNOSIS — Z1231 Encounter for screening mammogram for malignant neoplasm of breast: Secondary | ICD-10-CM

## 2011-08-26 ENCOUNTER — Encounter: Payer: Self-pay | Admitting: Family Medicine

## 2011-08-26 ENCOUNTER — Ambulatory Visit (INDEPENDENT_AMBULATORY_CARE_PROVIDER_SITE_OTHER): Payer: 59 | Admitting: Family Medicine

## 2011-08-26 VITALS — BP 94/62

## 2011-08-26 DIAGNOSIS — N950 Postmenopausal bleeding: Secondary | ICD-10-CM

## 2011-08-26 NOTE — Progress Notes (Signed)
  Subjective:    Patient ID: Rebecca Hart, female    DOB: [birth year], 56 y.o.   MRN: [id number]  HPI Rebecca Hart is a 56 year old married female nonsmoker who comes in today for followup of postmenopausal breakthrough bleeding.  As noticed in her previous note she had skipped one of her HRT pills had some breakthrough spotting. We therefore doubled her medication the bleeding stopped she would back to one pill a day and now she spotting again. Because of the recurrence I would recommend she have a D&C. I saw that it's not an endometrial cancer but because of the breakthrough bleeding a D&C is indicated.  I explained this to her and she concurs   Review of Systems General and GYN review of systems otherwise negative    Objective:   Physical Exam Well-developed well-nourished female no acute distress       Assessment & Plan:  Postmenopausal bleeding GYN consult with Dr. Arlyce Dice

## 2011-08-26 NOTE — Patient Instructions (Signed)
Continue the medication one daily  We will set you up a consult with Dr. Ilda Mori for evaluation

## 2011-09-02 ENCOUNTER — Ambulatory Visit
Admission: RE | Admit: 2011-09-02 | Discharge: 2011-09-02 | Disposition: A | Discharge status: Home / Self Care | Payer: 59 | Source: Ambulatory Visit | Attending: Family Medicine | Admitting: Family Medicine

## 2011-09-02 DIAGNOSIS — Z1231 Encounter for screening mammogram for malignant neoplasm of breast: Secondary | ICD-10-CM

## 2012-02-11 ENCOUNTER — Other Ambulatory Visit: Payer: Self-pay | Admitting: Family Medicine

## 2012-06-02 ENCOUNTER — Other Ambulatory Visit: Payer: Self-pay | Admitting: Occupational Medicine

## 2012-06-02 ENCOUNTER — Ambulatory Visit: Payer: Self-pay

## 2012-06-02 DIAGNOSIS — R7612 Nonspecific reaction to cell mediated immunity measurement of gamma interferon antigen response without active tuberculosis: Secondary | ICD-10-CM

## 2012-06-17 ENCOUNTER — Encounter: Payer: Self-pay | Admitting: Gastroenterology

## 2012-07-14 ENCOUNTER — Telehealth: Payer: Self-pay | Admitting: Family Medicine

## 2012-07-14 DIAGNOSIS — F329 Major depressive disorder, single episode, unspecified: Secondary | ICD-10-CM

## 2012-07-14 DIAGNOSIS — N951 Menopausal and female climacteric states: Secondary | ICD-10-CM

## 2012-07-14 DIAGNOSIS — F3289 Other specified depressive episodes: Secondary | ICD-10-CM

## 2012-07-14 NOTE — Telephone Encounter (Signed)
Pt cpx is schedules for 09/29/12, but she will be out of medication prior to that date. She needs a refill of the following medications, zolpidem (AMBIEN) 5 MG tablet, estrogen, conjugated,-medroxyprogesterone (PREMPRO) 0.3-1.5 MG per tablet, and citalopram (CELEXA) 20 MG tablet. She would like these sent to the [facility]. Please assist.

## 2012-07-14 NOTE — Telephone Encounter (Signed)
Caller: Debbie/Rebecca Hart; Phone: [phone]; Reason for Call: Rebecca Hart has been taking Prempro for 3 years.  States she and Dr.  Tawanna Cooler agreed to have her take prempro x 3 years, then discuss stopping it.  Has done well on the medication with no problems or side effects.  States she does not have appt with Dr.  Tawanna Cooler until July 2014, and wants to know if she should stop the medication, and if so, how should she reduce it?  RN unable to answer question for the Rebecca Hart; info to office for provider review/callback.  May reach Rebecca Hart at [phone].  Krs/can

## 2012-07-15 MED ORDER — CITALOPRAM HYDROBROMIDE 20 MG PO TABS: ORAL_TABLET | ORAL | Status: DC

## 2012-07-15 MED ORDER — CONJ ESTROG-MEDROXYPROGEST ACE 0.3-1.5 MG PO TABS: 1.0000 | ORAL_TABLET | Freq: Every day | ORAL | Status: DC

## 2012-07-15 MED ORDER — ZOLPIDEM TARTRATE 5 MG PO TABS: 5.0000 mg | ORAL_TABLET | Freq: Every evening | ORAL | Status: DC | PRN

## 2012-07-15 NOTE — Telephone Encounter (Signed)
Spoke with patient and Dr Tawanna Cooler with discuss at next office visit

## 2012-08-11 ENCOUNTER — Other Ambulatory Visit (INDEPENDENT_AMBULATORY_CARE_PROVIDER_SITE_OTHER): Payer: 59

## 2012-08-11 DIAGNOSIS — Z0000 Encounter for general adult medical examination without abnormal findings: Secondary | ICD-10-CM

## 2012-08-11 LAB — CBC WITH DIFFERENTIAL/PLATELET
Eosinophils Absolute: 0.1 10*3/uL (ref 0.0–0.7)
Eosinophils Relative: 1.4 % (ref 0.0–5.0)
HCT: 37.6 % (ref 36.0–46.0)
Lymphs Abs: 2.5 10*3/uL (ref 0.7–4.0)
MCHC: 32.9 g/dL (ref 30.0–36.0)
MCV: 101.6 fl — ABNORMAL HIGH (ref 78.0–100.0)
Monocytes Absolute: 0.5 10*3/uL (ref 0.1–1.0)
Neutrophils Relative %: 44.2 % (ref 43.0–77.0)
Platelets: 264 10*3/uL (ref 150.0–400.0)
WBC: 5.5 10*3/uL (ref 4.5–10.5)

## 2012-08-11 LAB — BASIC METABOLIC PANEL
BUN: 12 mg/dL (ref 6–23)
CO2: 28 mEq/L (ref 19–32)
Chloride: 106 mEq/L (ref 96–112)
Creatinine, Ser: 0.8 mg/dL (ref 0.4–1.2)
Glucose, Bld: 88 mg/dL (ref 70–99)
Potassium: 5.7 mEq/L — ABNORMAL HIGH (ref 3.5–5.1)

## 2012-08-11 LAB — POCT URINALYSIS DIPSTICK
Bilirubin, UA: NEGATIVE
Ketones, UA: NEGATIVE
Nitrite, UA: NEGATIVE
Protein, UA: NEGATIVE
pH, UA: 7.5

## 2012-08-11 LAB — TSH: TSH: 1.96 u[IU]/mL (ref 0.35–5.50)

## 2012-08-11 LAB — LIPID PANEL
Cholesterol: 182 mg/dL (ref 0–200)
Total CHOL/HDL Ratio: 2
Triglycerides: 58 mg/dL (ref 0.0–149.0)

## 2012-08-11 LAB — HEPATIC FUNCTION PANEL
ALT: 24 U/L (ref 0–35)
Bilirubin, Direct: 0 mg/dL (ref 0.0–0.3)
Total Bilirubin: 0.4 mg/dL (ref 0.3–1.2)
Total Protein: 6.6 g/dL (ref 6.0–8.3)

## 2012-08-18 ENCOUNTER — Encounter: Payer: Self-pay | Admitting: Family Medicine

## 2012-08-18 ENCOUNTER — Other Ambulatory Visit (HOSPITAL_COMMUNITY)
Admission: RE | Admit: 2012-08-18 | Discharge: 2012-08-18 | Disposition: A | Discharge status: Home / Self Care | Payer: 59 | Source: Ambulatory Visit | Attending: Family Medicine | Admitting: Family Medicine

## 2012-08-18 ENCOUNTER — Ambulatory Visit (INDEPENDENT_AMBULATORY_CARE_PROVIDER_SITE_OTHER): Payer: 59 | Admitting: Family Medicine

## 2012-08-18 VITALS — BP 110/78 | Temp 98.3°F | Ht 68.25 in | Wt 155.0 lb

## 2012-08-18 DIAGNOSIS — Z01419 Encounter for gynecological examination (general) (routine) without abnormal findings: Secondary | ICD-10-CM | POA: Insufficient documentation

## 2012-08-18 DIAGNOSIS — J309 Allergic rhinitis, unspecified: Secondary | ICD-10-CM

## 2012-08-18 DIAGNOSIS — R21 Rash and other nonspecific skin eruption: Secondary | ICD-10-CM

## 2012-08-18 DIAGNOSIS — F329 Major depressive disorder, single episode, unspecified: Secondary | ICD-10-CM

## 2012-08-18 DIAGNOSIS — N951 Menopausal and female climacteric states: Secondary | ICD-10-CM

## 2012-08-18 MED ORDER — CITALOPRAM HYDROBROMIDE 20 MG PO TABS: ORAL_TABLET | ORAL | Status: DC

## 2012-08-18 MED ORDER — ZOLPIDEM TARTRATE 5 MG PO TABS: 5.0000 mg | ORAL_TABLET | Freq: Every evening | ORAL | Status: DC | PRN

## 2012-08-18 MED ORDER — CONJ ESTROG-MEDROXYPROGEST ACE 0.3-1.5 MG PO TABS: 1.0000 | ORAL_TABLET | Freq: Every day | ORAL | Status: DC

## 2012-08-18 MED ORDER — PREDNISONE 20 MG PO TABS: ORAL_TABLET | ORAL | Status: DC

## 2012-08-18 NOTE — Patient Instructions (Signed)
Take the prednisone as directed  Continue your other medications except this winter lets try going off the Prempro,,,,,,,,,, will discuss methodology  Do a thorough breast exam monthly and continue your annual mammography  Sunscreens 50+  Return in one year for general physical exam sooner if any problems

## 2012-08-18 NOTE — Progress Notes (Signed)
  Subjective:    Patient ID: Rebecca Hart, female    DOB: February 24, 1956, 57 y.o.   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 She had a lot of perimenopausal symptoms dysfunction uterine bleeding and the Prempro has really helped. However it's been about 3-4 years and is time to start seeing if we can taper off her hormones.  She takes Ambien 5 mg each bedtime when necessary for sleep  She gets routine eye care, dental care, she does not do BSE monthly, and you mammography, colonoscopy when she was 51 normal, tetanus 2006  She states she otherwise feels well except she's had a skin rash. It started on Monday. She woke up and noticed a diffuse rash over arms back legs hips. It's red slightly raised and nonpruritic. She has not had any antecedent illnesses  She does work at the hospital and she thought it might be heat rash because she had to wear special gowns for isolation patient's.   Review of Systems  Constitutional: Negative.   HENT: Negative.   Eyes: Negative.   Respiratory: Negative.   Cardiovascular: Negative.   Gastrointestinal: Negative.   Genitourinary: Negative.   Musculoskeletal: Negative.   Neurological: Negative.   Psychiatric/Behavioral: Negative.        Objective:   Physical Exam  Constitutional: She appears well-developed and well-nourished.  HENT:  Head: Normocephalic and atraumatic.  Right Ear: External ear normal.  Left Ear: External ear normal.  Nose: Nose normal.  Mouth/Throat: Oropharynx is clear and moist.  Eyes: EOM are normal. Pupils are equal, round, and reactive to light.  Neck: Normal range of motion. Neck supple. No thyromegaly present.  Cardiovascular: Normal rate, regular rhythm, normal heart  sounds and intact distal pulses.  Exam reveals no gallop and no friction rub.   No murmur heard. Pulmonary/Chest: Effort normal and breath sounds normal.  Abdominal: Soft. Bowel sounds are normal. She exhibits no distension and no mass. There is no tenderness. There is no rebound.  Genitourinary: Vagina normal and uterus normal. Guaiac negative stool. No vaginal discharge found.  Bilateral breast exam was normal she was taught BSE and asked to do it monthly  Musculoskeletal: Normal range of motion. She exhibits no edema and no tenderness.  Lymphadenopathy:    She has no cervical adenopathy.  Neurological: She is alert. She has normal reflexes. No cranial nerve deficit. She exhibits normal muscle tone. Coordination normal.  Skin: Skin is warm and dry.  Total body skin exam shows a garden variety of freckles capillary hemangiomas. She has a diffuse rash over her hips back arms and inner thighs. It's red slightly raised and nonpleuritic.  Psychiatric: She has a normal mood and affect. Her behavior is normal. Judgment and thought content normal.          Assessment & Plan:  Healthy female  History of mild depression continue Celexa 30 mg daily  Postmenopausal taper Prempro this winter. Sleep dysfunction Ambien when necessary  Skin rash unknown etiology,,,,,,, prednisone burst and taper

## 2012-08-30 ENCOUNTER — Ambulatory Visit (INDEPENDENT_AMBULATORY_CARE_PROVIDER_SITE_OTHER): Payer: 59 | Admitting: Family Medicine

## 2012-08-30 ENCOUNTER — Encounter: Payer: Self-pay | Admitting: Family Medicine

## 2012-08-30 VITALS — BP 110/76

## 2012-08-30 DIAGNOSIS — R21 Rash and other nonspecific skin eruption: Secondary | ICD-10-CM

## 2012-08-30 NOTE — Patient Instructions (Signed)
2 tabs daily until the rash is 100% gone then taper as follows,,,,,,,,,,, 1-1/2 tablets for 3 days, 1 tablet for 3 days, a half a tablet for 3 days, then a half a tablet Monday Wednesday Friday for a 3 week taper  If this program does not resolve the rash recurs then I would recommend we get a consult from Dr. Arlyss Gandy

## 2012-08-30 NOTE — Progress Notes (Signed)
  Subjective:    Patient ID: Rebecca Hart, female    DOB: [birth year], 56 y.o.   MRN: [id number]  HPI Stanton Kidney is a 57 year old married female nonsmoker who comes in today for recurrent skin rash  We saw her a couple weeks ago with a skin rash etiology unknown. The lesions were diffuse erythematous and periodic. She works in the hospital and wants to note if it could be latex allergy. We gave her 20 mg of prednisone daily for 5 days and after 2 doses the rash went away. She taper slowly as soon as she finished the prednisone the rash came back   Review of Systems Review of systems otherwise negative    Objective:   Physical Exam  Well-developed well-nourished female no acute distress examination of the skin shows diffuse erythematous macular lesions throughout her back arms and legs and groin.      Assessment & Plan:  Allergic reaction etiology unknown plan another round of prednisone immunology consult if symptoms persist

## 2012-09-22 ENCOUNTER — Other Ambulatory Visit: Payer: Self-pay

## 2012-09-29 ENCOUNTER — Encounter: Payer: Self-pay | Admitting: Family Medicine

## 2013-02-20 ENCOUNTER — Other Ambulatory Visit: Payer: Self-pay | Admitting: Family Medicine

## 2013-08-17 ENCOUNTER — Other Ambulatory Visit (INDEPENDENT_AMBULATORY_CARE_PROVIDER_SITE_OTHER): Payer: 59

## 2013-08-17 DIAGNOSIS — Z0000 Encounter for general adult medical examination without abnormal findings: Secondary | ICD-10-CM

## 2013-08-17 LAB — POCT URINALYSIS DIPSTICK
Bilirubin, UA: NEGATIVE
Blood, UA: NEGATIVE
Glucose, UA: NEGATIVE
KETONES UA: NEGATIVE
Nitrite, UA: POSITIVE
PH UA: 7
PROTEIN UA: NEGATIVE
Spec Grav, UA: 1.015
UROBILINOGEN UA: 0.2

## 2013-08-17 LAB — BASIC METABOLIC PANEL
BUN: 11 mg/dL (ref 6–23)
CALCIUM: 9.6 mg/dL (ref 8.4–10.5)
CO2: 25 mEq/L (ref 19–32)
Chloride: 104 mEq/L (ref 96–112)
Creatinine, Ser: 0.7 mg/dL (ref 0.4–1.2)
GFR: 86 mL/min (ref >60.00)
GLUCOSE: 96 mg/dL (ref 70–99)
POTASSIUM: 4.6 meq/L (ref 3.5–5.1)
SODIUM: 140 meq/L (ref 135–145)

## 2013-08-17 LAB — CBC WITH DIFFERENTIAL/PLATELET
BASOS ABS: 0 10*3/uL (ref 0.0–0.1)
Basophils Relative: 0.5 % (ref 0.0–3.0)
EOS PCT: 2.2 % (ref 0.0–5.0)
Eosinophils Absolute: 0.1 10*3/uL (ref 0.0–0.7)
HEMATOCRIT: 38.3 % (ref 36.0–46.0)
HEMOGLOBIN: 12.7 g/dL (ref 12.0–15.0)
LYMPHS ABS: 2.1 10*3/uL (ref 0.7–4.0)
LYMPHS PCT: 35.4 % (ref 12.0–46.0)
MCHC: 33.2 g/dL (ref 30.0–36.0)
MCV: 99 fl (ref 78.0–100.0)
MONOS PCT: 8.8 % (ref 3.0–12.0)
Monocytes Absolute: 0.5 10*3/uL (ref 0.1–1.0)
NEUTROS ABS: 3.1 10*3/uL (ref 1.4–7.7)
Neutrophils Relative %: 53.1 % (ref 43.0–77.0)
PLATELETS: 232 10*3/uL (ref 150.0–400.0)
RBC: 3.87 Mil/uL (ref 3.87–5.11)
RDW: 13.8 % (ref 11.5–15.5)
WBC: 5.8 10*3/uL (ref 4.0–10.5)

## 2013-08-17 LAB — HEPATIC FUNCTION PANEL
ALK PHOS: 59 U/L (ref 39–117)
ALT: 26 U/L (ref 0–35)
AST: 33 U/L (ref 0–37)
Albumin: 4.2 g/dL (ref 3.5–5.2)
BILIRUBIN TOTAL: 0.6 mg/dL (ref 0.2–1.2)
Bilirubin, Direct: 0.1 mg/dL (ref 0.0–0.3)
Total Protein: 7.1 g/dL (ref 6.0–8.3)

## 2013-08-17 LAB — LIPID PANEL
CHOLESTEROL: 196 mg/dL (ref 0–200)
HDL: 75.5 mg/dL (ref >39.00)
LDL Cholesterol: 105 mg/dL — ABNORMAL HIGH (ref 0–99)
NONHDL: 120.5
Total CHOL/HDL Ratio: 3
Triglycerides: 76 mg/dL (ref 0.0–149.0)
VLDL: 15.2 mg/dL (ref 0.0–40.0)

## 2013-08-17 LAB — TSH: TSH: 1.18 u[IU]/mL (ref 0.35–4.50)

## 2013-08-24 ENCOUNTER — Encounter: Payer: Self-pay | Admitting: Family Medicine

## 2013-08-29 ENCOUNTER — Ambulatory Visit (INDEPENDENT_AMBULATORY_CARE_PROVIDER_SITE_OTHER): Payer: 59 | Admitting: Family Medicine

## 2013-08-29 ENCOUNTER — Encounter: Payer: Self-pay | Admitting: Family Medicine

## 2013-08-29 VITALS — BP 110/70 | Temp 98.9°F | Ht 67.25 in | Wt 157.0 lb

## 2013-08-29 DIAGNOSIS — N951 Menopausal and female climacteric states: Secondary | ICD-10-CM

## 2013-08-29 DIAGNOSIS — F3289 Other specified depressive episodes: Secondary | ICD-10-CM

## 2013-08-29 DIAGNOSIS — L719 Rosacea, unspecified: Secondary | ICD-10-CM

## 2013-08-29 DIAGNOSIS — G4700 Insomnia, unspecified: Secondary | ICD-10-CM

## 2013-08-29 DIAGNOSIS — F329 Major depressive disorder, single episode, unspecified: Secondary | ICD-10-CM

## 2013-08-29 MED ORDER — METRONIDAZOLE 1 % EX GEL
Freq: Every day | CUTANEOUS | Status: DC
Start: 2013-08-29 — End: 2014-09-18

## 2013-08-29 MED ORDER — TRAZODONE HCL 50 MG PO TABS: 25.0000 mg | ORAL_TABLET | Freq: Every evening | ORAL | Status: DC | PRN

## 2013-08-29 MED ORDER — CITALOPRAM HYDROBROMIDE 20 MG PO TABS: ORAL_TABLET | ORAL | Status: DC

## 2013-08-29 MED ORDER — ESTROGENS, CONJUGATED 0.625 MG/GM VA CREA: 1.0000 | TOPICAL_CREAM | Freq: Every day | VAGINAL | Status: DC

## 2013-08-29 NOTE — Patient Instructions (Signed)
Trazodone 50 mg,,,,,,,,,,,, one half tab each bedtime followup in 6 weeks  Celexa 20 mg,,,,,,,,,,, one daily at bedtime  Premarin vaginal cream,,,,,,,,,,, small amounts 3 times weekly  MetroGel,,,,,,,,,,, apply each bedtime for rosacea

## 2013-08-29 NOTE — Progress Notes (Signed)
   Subjective:    Patient ID: Rebecca Hart, female    DOB: 06/03/1955, 58 y.o.   MRN: [id number]  HPI Rebecca Hart is a 58 year old married female nonsmoker who comes in today for general physical examination  She has a history of mild depression for which he takes Celexa 20 mg daily  She stopped her Prempro March 2015 asymptomatic except for dry vagina and insomnia  Ambien no longer works for the insomnia  She gets routine eye care, dental care, BSE monthly, due for a mammogram, colonoscopy age 58 normal,  She uses MetroGel for rosacea   Review of Systems  Constitutional: Negative.   HENT: Negative.   Eyes: Negative.   Respiratory: Negative.   Cardiovascular: Negative.   Gastrointestinal: Negative.   Genitourinary: Negative.   Musculoskeletal: Negative.   Neurological: Negative.   Psychiatric/Behavioral: Negative.        Objective:   Physical Exam  Constitutional: She is oriented to person, place, and time. She appears well-developed and well-nourished.  HENT:  Head: Normocephalic and atraumatic.  Right Ear: External ear normal.  Left Ear: External ear normal.  Nose: Nose normal.  Mouth/Throat: Oropharynx is clear and moist.  Eyes: EOM are normal. Pupils are equal, round, and reactive to light.  Neck: Normal range of motion. Neck supple. No thyromegaly present.  Cardiovascular: Normal rate, regular rhythm, normal heart sounds and intact distal pulses.  Exam reveals no gallop and no friction rub.   No murmur heard. Pulmonary/Chest: Effort normal and breath sounds normal.  Abdominal: Soft. Bowel sounds are normal. She exhibits no distension and no mass. There is no tenderness. There is no rebound.  Genitourinary:  Bilateral breast exam normal  Pap smear last year normal therefore not repeated  Musculoskeletal: Normal range of motion.  Lymphadenopathy:    She has no cervical adenopathy.  Neurological: She is alert and oriented to person, place, and time. She has  normal reflexes. No cranial nerve deficit. She exhibits normal muscle tone. Coordination normal.  Skin: Skin is warm and dry.  Psychiatric: She has a normal mood and affect. Her behavior is normal. Judgment and thought content normal.          Assessment & Plan:  Healthy female  Menopause related insomnia........ Trial of trazodone  History of mild depression continue Celexa  Vaginal dryness......... Premarin cream  Rosacea..........Marland Kitchen. MetroGel

## 2013-08-29 NOTE — Progress Notes (Signed)
Pre visit review using our clinic review tool, if applicable. No additional management support is needed unless otherwise documented below in the visit note. 

## 2013-08-31 ENCOUNTER — Other Ambulatory Visit: Payer: Self-pay | Admitting: *Deleted

## 2013-08-31 DIAGNOSIS — N951 Menopausal and female climacteric states: Secondary | ICD-10-CM

## 2013-08-31 MED ORDER — ESTROGENS, CONJUGATED 0.625 MG/GM VA CREA: TOPICAL_CREAM | VAGINAL | Status: DC

## 2013-10-17 ENCOUNTER — Ambulatory Visit (INDEPENDENT_AMBULATORY_CARE_PROVIDER_SITE_OTHER): Payer: 59 | Admitting: Family Medicine

## 2013-10-17 ENCOUNTER — Other Ambulatory Visit (HOSPITAL_COMMUNITY)
Admission: RE | Admit: 2013-10-17 | Discharge: 2013-10-17 | Disposition: A | Discharge status: Home / Self Care | Payer: 59 | Source: Ambulatory Visit | Attending: Family Medicine | Admitting: Family Medicine

## 2013-10-17 ENCOUNTER — Encounter: Payer: Self-pay | Admitting: Family Medicine

## 2013-10-17 VITALS — BP 110/80

## 2013-10-17 DIAGNOSIS — Z01419 Encounter for gynecological examination (general) (routine) without abnormal findings: Secondary | ICD-10-CM | POA: Insufficient documentation

## 2013-10-17 DIAGNOSIS — N951 Menopausal and female climacteric states: Secondary | ICD-10-CM

## 2013-10-17 DIAGNOSIS — G4700 Insomnia, unspecified: Secondary | ICD-10-CM

## 2013-10-17 DIAGNOSIS — N950 Postmenopausal bleeding: Secondary | ICD-10-CM

## 2013-10-17 DIAGNOSIS — Z1151 Encounter for screening for human papillomavirus (HPV): Secondary | ICD-10-CM | POA: Insufficient documentation

## 2013-10-17 MED ORDER — TRAZODONE HCL 100 MG PO TABS: 100.0000 mg | ORAL_TABLET | Freq: Every day | ORAL | Status: DC

## 2013-10-17 MED ORDER — LORAZEPAM 0.5 MG PO TABS: 0.5000 mg | ORAL_TABLET | Freq: Two times a day (BID) | ORAL | Status: DC | PRN

## 2013-10-17 NOTE — Progress Notes (Signed)
Pre visit review using our clinic review tool, if applicable. No additional management support is needed unless otherwise documented below in the visit note. 

## 2013-10-17 NOTE — Patient Instructions (Signed)
Increase the trazodone to 100 mg daily  Hold the Celexa  Ativan 0.5..............Marland Kitchen. 1 by mouth each bedtime when necessary  [facility]............ set up a consult because of the vaginal spotting

## 2013-10-17 NOTE — Progress Notes (Signed)
   Subjective:    Patient ID: Rebecca Hart, female    DOB: 04/23/1955, 58 y.o.   MRN: [id number]  HPI  Rebecca Hart is a 58 year old married female nonsmoker who comes in today for evaluation of 2 problems  We've been treating her with Desyrel 50 mg for insomnia and has not helped. She is difficulty going to sleep when she goes to sleep she feels okay. We discussed various options. We elected to try to increase the Desyrel but had some Ativan each bedtime so she can sleep  Last week she use small amounts of hormonal cream 3 days and around and noticed some vaginal spotting Saturday and Sunday. Last Pap was 2 years ago. LMP was 7 years ago  About 5 years ago she had a similar problem. We sent her to bring dilate. They did a workup which was negative and she had no more spotting  Review of Systems    review of systems otherwise negative Objective:   Physical Exam  Well-developed well nourished female no acute distress vital signs stable she is afebrile  Abdominal exam was normal  External genitalia within normal limits vaginal vault was normal cervix was visualized Pap smear was done. I can see no evidence of any blood. Bimanual exam negative      Assessment & Plan:  Insomnia............. increase trazodone and Ativan 0.5  Postmenopausal vaginal bleeding......... GYN reconsult.

## 2013-10-18 LAB — CYTOLOGY - PAP

## 2013-10-24 ENCOUNTER — Telehealth: Payer: Self-pay | Admitting: Family Medicine

## 2013-10-24 DIAGNOSIS — F3289 Other specified depressive episodes: Secondary | ICD-10-CM

## 2013-10-24 DIAGNOSIS — F329 Major depressive disorder, single episode, unspecified: Secondary | ICD-10-CM

## 2013-10-24 MED ORDER — CITALOPRAM HYDROBROMIDE 20 MG PO TABS: ORAL_TABLET | ORAL | Status: DC

## 2013-10-24 NOTE — Telephone Encounter (Signed)
Okay per Dr Tawanna Coolerodd.  Patient is aware and refill sent

## 2013-10-24 NOTE — Telephone Encounter (Signed)
Pt states dr. Tawanna Coolerodd switched her to rxtraZODone (DESYREL) 100 MG tablet, pt states the meds is not working for her, she has ringing in her ears, pt would like to know if she can go back to celexa or what ever dr. Tawanna Coolerodd recommends.  Cell # [phone]

## 2013-10-25 ENCOUNTER — Other Ambulatory Visit: Payer: Self-pay | Admitting: *Deleted

## 2013-10-25 DIAGNOSIS — F3289 Other specified depressive episodes: Secondary | ICD-10-CM

## 2013-10-25 DIAGNOSIS — F329 Major depressive disorder, single episode, unspecified: Secondary | ICD-10-CM

## 2013-10-25 MED ORDER — CITALOPRAM HYDROBROMIDE 20 MG PO TABS: ORAL_TABLET | ORAL | Status: DC

## 2013-10-28 ENCOUNTER — Encounter: Payer: Self-pay | Admitting: Gastroenterology

## 2013-11-07 ENCOUNTER — Telehealth: Payer: Self-pay | Admitting: Family Medicine

## 2013-11-07 NOTE — Telephone Encounter (Signed)
I picked up the PA's from last week yesterday and hers is in the process of being faxed to the insurance company today.

## 2013-11-07 NOTE — Telephone Encounter (Signed)
Pt is needing a prior authorization for rx citalopram (CELEXA) 20 MG tablet, states form was already faxed over 10/30/13, checking the status.

## 2014-01-17 ENCOUNTER — Telehealth: Payer: Self-pay | Admitting: Family Medicine

## 2014-01-17 NOTE — Telephone Encounter (Signed)
Dr Tawanna Coolerodd is aware.  He will discuss injection at office visit.

## 2014-01-17 NOTE — Telephone Encounter (Signed)
Pt has made appt tomorrow and wants and inj in her hip for bursitis. appt made , but advised pt ai would send a message to ensure sure he will still do that for her.  Pt states he gave her inj in shoulder 10 yrs ago,. pls advise

## 2014-01-18 ENCOUNTER — Ambulatory Visit
Admission: RE | Admit: 2014-01-18 | Discharge: 2014-01-18 | Disposition: A | Discharge status: Home / Self Care | Payer: 59 | Source: Ambulatory Visit | Attending: Family Medicine | Admitting: Family Medicine

## 2014-01-18 ENCOUNTER — Encounter: Payer: Self-pay | Admitting: Family Medicine

## 2014-01-18 ENCOUNTER — Ambulatory Visit (INDEPENDENT_AMBULATORY_CARE_PROVIDER_SITE_OTHER)
Admission: RE | Admit: 2014-01-18 | Discharge: 2014-01-18 | Disposition: A | Discharge status: Home / Self Care | Payer: 59 | Source: Ambulatory Visit | Attending: Family Medicine | Admitting: Family Medicine

## 2014-01-18 ENCOUNTER — Ambulatory Visit (INDEPENDENT_AMBULATORY_CARE_PROVIDER_SITE_OTHER): Payer: 59 | Admitting: Family Medicine

## 2014-01-18 ENCOUNTER — Other Ambulatory Visit: Payer: Self-pay | Admitting: Family Medicine

## 2014-01-18 VITALS — BP 124/80 | Temp 98.1°F | Ht 68.5 in | Wt 159.0 lb

## 2014-01-18 DIAGNOSIS — M25552 Pain in left hip: Secondary | ICD-10-CM

## 2014-01-18 DIAGNOSIS — M25551 Pain in right hip: Secondary | ICD-10-CM

## 2014-01-18 LAB — HEPATIC FUNCTION PANEL
ALBUMIN: 3.7 g/dL (ref 3.5–5.2)
ALT: 24 U/L (ref 0–35)
AST: 30 U/L (ref 0–37)
Alkaline Phosphatase: 105 U/L (ref 39–117)
Bilirubin, Direct: 0 mg/dL (ref 0.0–0.3)
Total Bilirubin: 0.3 mg/dL (ref 0.2–1.2)
Total Protein: 6.9 g/dL (ref 6.0–8.3)

## 2014-01-18 LAB — BASIC METABOLIC PANEL
BUN: 16 mg/dL (ref 6–23)
CO2: 25 mEq/L (ref 19–32)
CREATININE: 0.7 mg/dL (ref 0.4–1.2)
Calcium: 9.1 mg/dL (ref 8.4–10.5)
Chloride: 107 mEq/L (ref 96–112)
GFR: 85.87 mL/min (ref >60.00)
Glucose, Bld: 98 mg/dL (ref 70–99)
POTASSIUM: 4.2 meq/L (ref 3.5–5.1)
Sodium: 142 mEq/L (ref 135–145)

## 2014-01-18 LAB — CALCIUM: Calcium: 9.1 mg/dL (ref 8.4–10.5)

## 2014-01-18 MED ORDER — TRAMADOL HCL 50 MG PO TABS: 50.0000 mg | ORAL_TABLET | Freq: Three times a day (TID) | ORAL | Status: DC | PRN

## 2014-01-18 NOTE — Patient Instructions (Signed)
Aleve one twice daily  Continue the orthotics  Ice packs,,,,,,,, or heating pad daily at bedtime when necessary  Tramadol 50 mg,,,,,,,, one half to one tablet at bedtime  X-rays today  Lab work today  I will call you the report ASAP

## 2014-01-18 NOTE — Progress Notes (Signed)
Pre visit review using our clinic review tool, if applicable. No additional management support is needed unless otherwise documented below in the visit note. 

## 2014-01-18 NOTE — Progress Notes (Signed)
   Subjective:    Patient ID: Rebecca Hart, female    DOB: 09/19/1955, 58 y.o.   MRN: [id number]  HPI Rebecca Hart is a 58 year old married female nonsmoker who comes in today for evaluation of bilateral hip pain  She states that  Both hips hurt however the left hurts worse than the right. This is been going on for about 5 months. No history of trauma. 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  She describes her pain is sometimes sharp sometimes dull it's constant it's a 5 on a scale of 1-10 and it keeps her awake at night she can't sleep. She started running in her mid 30s and runs about 3 miles 3 or 4 times a week. She's done yoga got orthotics 2 weeks ago from the [employer],,,,,,,,, which is seem to help somewhat,,,,,, is been taking Aleve 2 tabs twice a day. Cautioned her that one tab twice a day is the max dose for Aleve will check a renal function today  Family history mother had a hip replacement  Again no history of trauma.   Review of Systems Review of systems otherwise negative    Objective:   Physical Exam  Well-developed well-nourished female no acute distress vital signs stable she's afebrile examination of the hips in the supine position,,,,,,,,,, both legs were of equal length. Sensation muscle strength reflexes all within normal limits. She can only externally rotate both hips to about 45 without pain.      Assessment & Plan:  Bilateral hip pain,,,,, 5 months,,,,,, no history of trauma,,,,,,,,, begin workup with x-ray lab work etc.

## 2014-04-07 IMAGING — CR DG CHEST 1V
1 series · 1 of 1 positions shown · non-contrast
Comparison: None.

CLINICAL DATA: 56-year-old female with positive Quantiferon TB
test.

CHEST - 1 VIEW

[view not recorded]
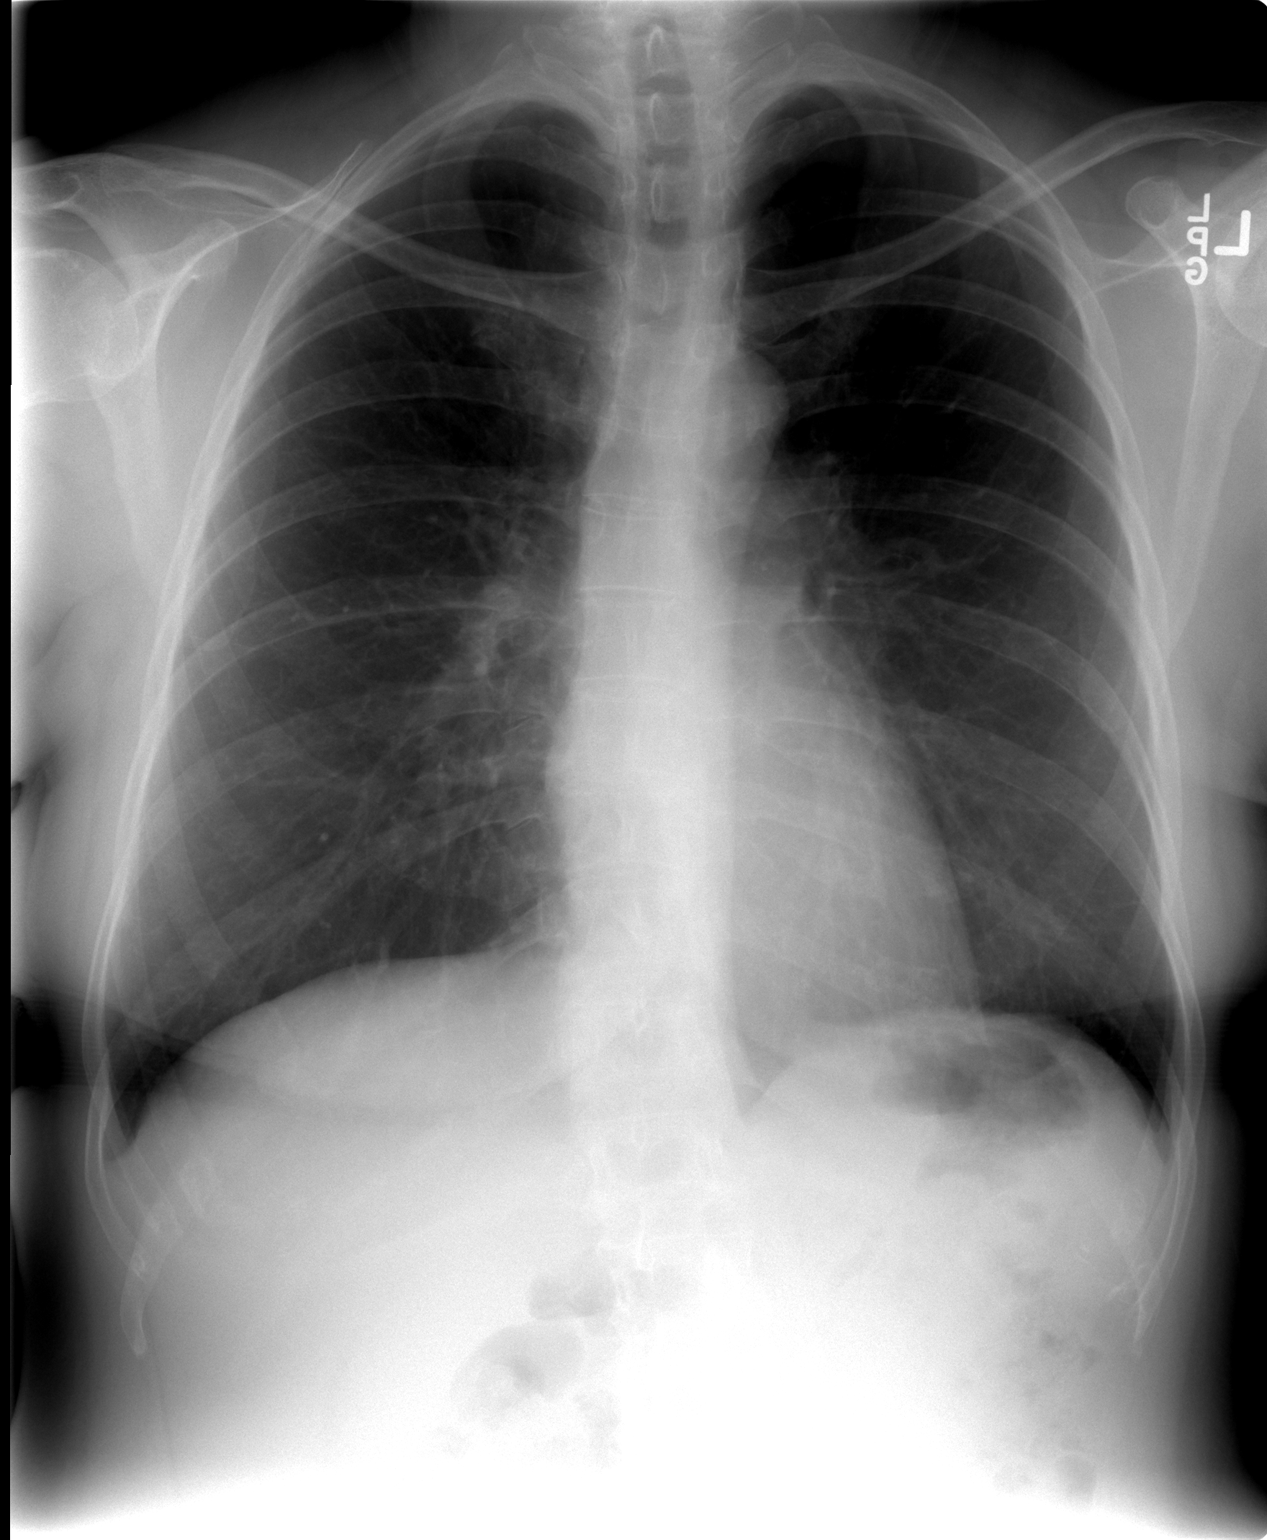

[1 of 1 positions shown; findings below may reference images not displayed]

FINDINGS: Cardiac size and mediastinal contours are within normal
limits.  Visualized tracheal air column is within normal limits.
Mild scoliosis.  No pneumothorax, pulmonary edema, pleural effusion
or confluent pulmonary opacity.  Upper lobes appear within normal
limits.
IMPRESSION: No acute cardiopulmonary abnormality.

## 2014-04-25 ENCOUNTER — Other Ambulatory Visit: Payer: Self-pay | Admitting: Family Medicine

## 2014-05-01 ENCOUNTER — Encounter: Payer: Self-pay | Admitting: Gastroenterology

## 2014-06-27 ENCOUNTER — Other Ambulatory Visit: Payer: Self-pay | Admitting: *Deleted

## 2014-06-27 DIAGNOSIS — F32A Depression, unspecified: Secondary | ICD-10-CM

## 2014-06-27 DIAGNOSIS — F329 Major depressive disorder, single episode, unspecified: Secondary | ICD-10-CM

## 2014-06-27 MED ORDER — CITALOPRAM HYDROBROMIDE 20 MG PO TABS: ORAL_TABLET | ORAL | Status: DC

## 2014-07-20 ENCOUNTER — Telehealth: Payer: Self-pay | Admitting: *Deleted

## 2014-07-20 DIAGNOSIS — Z1239 Encounter for other screening for malignant neoplasm of breast: Secondary | ICD-10-CM

## 2014-07-20 NOTE — Telephone Encounter (Signed)
Order for mammogram placed.

## 2014-09-04 ENCOUNTER — Other Ambulatory Visit: Payer: Self-pay | Admitting: *Deleted

## 2014-09-04 DIAGNOSIS — Z0000 Encounter for general adult medical examination without abnormal findings: Secondary | ICD-10-CM

## 2014-09-11 ENCOUNTER — Other Ambulatory Visit (INDEPENDENT_AMBULATORY_CARE_PROVIDER_SITE_OTHER): Payer: 59

## 2014-09-11 DIAGNOSIS — Z0000 Encounter for general adult medical examination without abnormal findings: Secondary | ICD-10-CM

## 2014-09-11 LAB — HEPATIC FUNCTION PANEL
ALK PHOS: 83 U/L (ref 39–117)
ALT: 22 U/L (ref 0–35)
AST: 29 U/L (ref 0–37)
Albumin: 3.8 g/dL (ref 3.5–5.2)
BILIRUBIN TOTAL: 0.5 mg/dL (ref 0.2–1.2)
Bilirubin, Direct: 0 mg/dL (ref 0.0–0.3)
Total Protein: 7 g/dL (ref 6.0–8.3)

## 2014-09-11 LAB — LIPID PANEL
CHOLESTEROL: 225 mg/dL — AB (ref 0–200)
HDL: 66.1 mg/dL (ref >39.00)
LDL CALC: 137 mg/dL — AB (ref 0–99)
NonHDL: 158.9
TRIGLYCERIDES: 110 mg/dL (ref 0.0–149.0)
Total CHOL/HDL Ratio: 3
VLDL: 22 mg/dL (ref 0.0–40.0)

## 2014-09-11 LAB — COMPREHENSIVE METABOLIC PANEL
ALT: 22 U/L (ref 0–35)
AST: 29 U/L (ref 0–37)
Albumin: 3.8 g/dL (ref 3.5–5.2)
Alkaline Phosphatase: 83 U/L (ref 39–117)
BILIRUBIN TOTAL: 0.5 mg/dL (ref 0.2–1.2)
BUN: 15 mg/dL (ref 6–23)
CO2: 24 mEq/L (ref 19–32)
CREATININE: 0.75 mg/dL (ref 0.40–1.20)
Calcium: 9.4 mg/dL (ref 8.4–10.5)
Chloride: 105 mEq/L (ref 96–112)
GFR: 84.36 mL/min (ref >60.00)
Glucose, Bld: 84 mg/dL (ref 70–99)
Potassium: 4.5 mEq/L (ref 3.5–5.1)
SODIUM: 141 meq/L (ref 135–145)
Total Protein: 7 g/dL (ref 6.0–8.3)

## 2014-09-11 LAB — CBC WITH DIFFERENTIAL/PLATELET
BASOS PCT: 1.1 % (ref 0.0–3.0)
Basophils Absolute: 0.1 10*3/uL (ref 0.0–0.1)
EOS PCT: 2.3 % (ref 0.0–5.0)
Eosinophils Absolute: 0.1 10*3/uL (ref 0.0–0.7)
HCT: 37 % (ref 36.0–46.0)
Hemoglobin: 12.7 g/dL (ref 12.0–15.0)
LYMPHS PCT: 39.9 % (ref 12.0–46.0)
Lymphs Abs: 2.1 10*3/uL (ref 0.7–4.0)
MCHC: 34.3 g/dL (ref 30.0–36.0)
MCV: 96.6 fl (ref 78.0–100.0)
Monocytes Absolute: 0.4 10*3/uL (ref 0.1–1.0)
Monocytes Relative: 7.7 % (ref 3.0–12.0)
NEUTROS PCT: 49 % (ref 43.0–77.0)
Neutro Abs: 2.6 10*3/uL (ref 1.4–7.7)
Platelets: 242 10*3/uL (ref 150.0–400.0)
RBC: 3.83 Mil/uL — AB (ref 3.87–5.11)
RDW: 13.8 % (ref 11.5–15.5)
WBC: 5.3 10*3/uL (ref 4.0–10.5)

## 2014-09-11 LAB — POCT URINALYSIS DIPSTICK
BILIRUBIN UA: NEGATIVE
Glucose, UA: NEGATIVE
Ketones, UA: NEGATIVE
NITRITE UA: NEGATIVE
Protein, UA: NEGATIVE
RBC UA: NEGATIVE
Spec Grav, UA: 1.015
Urobilinogen, UA: 0.2
pH, UA: 7

## 2014-09-11 LAB — TSH: TSH: 2.43 u[IU]/mL (ref 0.35–4.50)

## 2014-09-18 ENCOUNTER — Encounter: Payer: Self-pay | Admitting: Adult Health

## 2014-09-18 ENCOUNTER — Ambulatory Visit (INDEPENDENT_AMBULATORY_CARE_PROVIDER_SITE_OTHER): Payer: 59 | Admitting: Adult Health

## 2014-09-18 DIAGNOSIS — Z0000 Encounter for general adult medical examination without abnormal findings: Secondary | ICD-10-CM

## 2014-09-18 DIAGNOSIS — G4700 Insomnia, unspecified: Secondary | ICD-10-CM

## 2014-09-18 MED ORDER — SUVOREXANT 15 MG PO TABS: 15.0000 mg | ORAL_TABLET | Freq: Every day | ORAL | Status: DC

## 2014-09-18 MED ORDER — SUVOREXANT 10 MG PO TABS: 10.0000 mg | ORAL_TABLET | Freq: Every day | ORAL | Status: DC

## 2014-09-18 MED ORDER — SUVOREXANT 20 MG PO TABS: 20.0000 mg | ORAL_TABLET | Freq: Every day | ORAL | Status: DC

## 2014-09-18 NOTE — Patient Instructions (Signed)
It was great meeting you today!   Please work on diet and exercise so we can reduce your cholesterol level.   Try the Belsomra and let me know how it works for your sleep.   If you need anything, please let me know.

## 2014-09-18 NOTE — Progress Notes (Signed)
Subjective:    Patient ID: Rebecca Hart, female    DOB: 03/03/1956, 59 y.o.   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 She is a patient on Dr. Tawanna Coolerodd and I am seeing her in his absence.   She has a  history of mild depression and takes Celexa 20 mg daily.   Has been using ativan to help her sleep, she is not getting as much sleep as she once was with this medication.   She gets routine eye care, dental care, she does not do BSE monthly, she has a mammogram scheduled. Is seen by GYN for her pap  Review of Systems  Constitutional: Negative.   HENT: Negative.   Eyes: Negative.   Respiratory: Negative.   Cardiovascular: Negative.   Gastrointestinal: Negative.   Endocrine: Negative.   Genitourinary: Negative.   Musculoskeletal: Positive for myalgias and arthralgias.  Skin: Negative.   Allergic/Immunologic: Negative.   Neurological: Negative.   Hematological: Negative.   Psychiatric/Behavioral: Positive for sleep disturbance.   Past Medical History  Diagnosis Date  . Depression   . IBS (irritable bowel syndrome)     History   Social History  . Marital Status: Married    Spouse Name: N/A  . Number of Children: N/A  . Years of Education: N/A   Occupational History  . Not on file.   Social History Main Topics  . Smoking status: Never Smoker   . Smokeless tobacco: Not on file  . Alcohol Use: No  . Drug Use: No  . Sexual Activity: Not on file   Other Topics Concern  . Not on file   Social History Narrative    No past surgical history on file.  Family History  Problem Relation Age of Onset  . Hypertension Mother   . Lupus Sister     No Known Allergies  Current Outpatient Prescriptions on File Prior to Visit  Medication Sig Dispense Refill  . citalopram (CELEXA) 20 MG tablet 1 1/2 tabs nightly 135 tablet 1  . LORazepam  (ATIVAN) 0.5 MG tablet TAKE 1 TABLET BY MOUTH TWICE DAILY AS NEEDED FOR ANXIETY 30 tablet 4   No current facility-administered medications on file prior to visit.    BP 102/70 mmHg  Temp(Src) 98.6 F (37 C) (Oral)  Ht 5\' 8"  (1.727 m)  Wt 163 lb 12.8 oz (74.299 kg)  BMI 24.91 kg/m2  LMP 02/07/2010       Objective:   Physical Exam  Constitutional: She is oriented to person, place, and time. She appears well-developed and well-nourished. No distress.  HENT:  Head: Normocephalic and atraumatic.  Right Ear: External ear normal.  Left Ear: External ear normal.  Nose: Nose normal.  Mouth/Throat: Oropharynx is clear and moist. No oropharyngeal exudate.  TM's visualized. No cerumen impaction  Eyes: Conjunctivae and EOM are normal. Pupils are equal, round, and reactive to light. Right eye exhibits no discharge. Left eye exhibits no discharge. No scleral icterus.  Wearing glasses  Neck: Normal range of motion. Neck supple. No JVD present. No tracheal deviation present. No thyromegaly present.  Cardiovascular: Normal rate, regular rhythm, normal heart sounds and intact distal pulses.  Exam reveals no gallop and no friction rub.   No murmur heard. Pulmonary/Chest: Effort normal and breath sounds normal. No respiratory distress. She has no wheezes. She has no rales. She exhibits no  tenderness.  Abdominal: Soft. Bowel sounds are normal. She exhibits no distension and no mass. There is no tenderness. There is no rebound and no guarding.  Genitourinary:  Breast Exam: WNL  Musculoskeletal: Normal range of motion.  Lymphadenopathy:    She has no cervical adenopathy.  Neurological: She is alert and oriented to person, place, and time.  Skin: Skin is warm and dry. No rash noted. She is not diaphoretic. No erythema. No pallor.  Psychiatric: She has a normal mood and affect. Her behavior is normal. Judgment and thought content normal.  Nursing note and vitals reviewed.     Assessment & Plan:    1. Routine general medical examination at a health care facility - Follow up in one year for CPE - Follow up sooner if needed - Reviewed labs with patient. She understands the need to exercise and eat healthier in order to bring down her cholesterol level.    2. Insomnia  - Suvorexant (BELSOMRA) 10 MG TABS; Take 10 mg by mouth at bedtime.  Dispense: 10 tablet; Refill: 0 - Suvorexant (BELSOMRA) 15 MG TABS; Take 15 mg by mouth at bedtime.  Dispense: 10 tablet; Refill: 0 - Suvorexant (BELSOMRA) 20 MG TABS; Take 20 mg by mouth at bedtime.  Dispense: 10 tablet; Refill: 0 - Instructed on not taking ativan while taking Belsomra - She will follow up with me on how she is responding to medication use

## 2014-10-17 ENCOUNTER — Telehealth: Payer: Self-pay | Admitting: Family Medicine

## 2014-10-17 ENCOUNTER — Other Ambulatory Visit: Payer: Self-pay | Admitting: Adult Health

## 2014-10-17 MED ORDER — SUVOREXANT 20 MG PO TABS: 20.0000 mg | ORAL_TABLET | Freq: Every evening | ORAL | Status: DC | PRN

## 2014-10-17 NOTE — Telephone Encounter (Signed)
Filled. She can go online and get a savings card for $1/pill

## 2014-10-17 NOTE — Telephone Encounter (Signed)
Patient called to let MD know that the RX Belsomra 20 mg worked. Patient would like a RX refill on this medication.

## 2014-10-17 NOTE — Telephone Encounter (Signed)
Left a detailed message on pt's personalized voicemail making aware of Cory's recommendations.

## 2014-11-14 ENCOUNTER — Telehealth: Payer: Self-pay

## 2014-11-14 NOTE — Telephone Encounter (Signed)
The pt called and is hoping to belsomra  Pharmacy - [facility]  Pt callback - [phone]

## 2014-11-15 ENCOUNTER — Other Ambulatory Visit: Payer: Self-pay | Admitting: *Deleted

## 2014-11-15 MED ORDER — SUVOREXANT 20 MG PO TABS: 20.0000 mg | ORAL_TABLET | Freq: Every evening | ORAL | Status: DC | PRN

## 2014-11-15 NOTE — Telephone Encounter (Signed)
Rx called in and patient aware.  

## 2014-11-19 MED ORDER — SUVOREXANT 20 MG PO TABS: 20.0000 mg | ORAL_TABLET | Freq: Every day | ORAL | Status: DC

## 2014-11-19 NOTE — Telephone Encounter (Signed)
Rx called in and patient is aware she will need an office visit for more refills.

## 2015-01-09 ENCOUNTER — Telehealth: Payer: Self-pay | Admitting: *Deleted

## 2015-01-09 NOTE — Telephone Encounter (Signed)
Patient would like to know if it is okay to switch from Belsomra back to Ativan?

## 2015-01-10 MED ORDER — LORAZEPAM 0.5 MG PO TABS: 0.5000 mg | ORAL_TABLET | Freq: Two times a day (BID) | ORAL | Status: DC | PRN

## 2015-01-10 NOTE — Telephone Encounter (Signed)
Okay per Dr Tawanna Coolerodd and Rx called in. Left message on machine for patient.

## 2015-03-12 MED FILL — LORazepam 0.5 MG TABS: 0.5 | 15 days supply | Qty: 30 | Fill #2

## 2015-03-12 MED FILL — CITALOPRAM HBR 20 MG TABLET: 20 | 90 days supply | Qty: 135 | Fill #1

## 2015-04-10 MED FILL — LORazepam 0.5 MG TABS: 0.5 | 15 days supply | Qty: 30 | Fill #3

## 2015-05-06 DIAGNOSIS — Z713 Dietary counseling and surveillance: Secondary | ICD-10-CM | POA: Diagnosis not present

## 2015-05-06 MED FILL — LORazepam 0.5 MG TABS: 0.5 | 15 days supply | Qty: 30 | Fill #4

## 2015-05-21 DIAGNOSIS — R635 Abnormal weight gain: Secondary | ICD-10-CM | POA: Diagnosis not present

## 2015-05-21 DIAGNOSIS — Z6824 Body mass index (BMI) 24.0-24.9, adult: Secondary | ICD-10-CM | POA: Diagnosis not present

## 2015-05-21 DIAGNOSIS — R632 Polyphagia: Secondary | ICD-10-CM | POA: Diagnosis not present

## 2015-05-31 MED FILL — LORazepam 0.5 MG TABS: 0.5 | 15 days supply | Qty: 30 | Fill #5

## 2015-06-04 ENCOUNTER — Other Ambulatory Visit: Payer: Self-pay | Admitting: Family Medicine

## 2015-06-04 DIAGNOSIS — F32A Depression, unspecified: Secondary | ICD-10-CM

## 2015-06-04 DIAGNOSIS — F329 Major depressive disorder, single episode, unspecified: Secondary | ICD-10-CM

## 2015-06-04 MED ORDER — CITALOPRAM HYDROBROMIDE 20 MG PO TABS: ORAL_TABLET | ORAL | Status: DC

## 2015-06-04 MED FILL — CITALOPRAM HBR 20 MG TABLET: 20 | 90 days supply | Qty: 135 | Fill #0

## 2015-06-17 DIAGNOSIS — Z713 Dietary counseling and surveillance: Secondary | ICD-10-CM | POA: Diagnosis not present

## 2015-06-20 ENCOUNTER — Other Ambulatory Visit: Payer: Self-pay | Admitting: Family Medicine

## 2015-06-20 MED FILL — LORazepam 0.5 MG TABS: 0.5 | 15 days supply | Qty: 30 | Fill #0

## 2015-07-18 MED FILL — LORazepam 0.5 MG TABS: 0.5 | 15 days supply | Qty: 30 | Fill #1

## 2015-08-01 ENCOUNTER — Other Ambulatory Visit: Payer: Self-pay

## 2015-08-01 DIAGNOSIS — Z1231 Encounter for screening mammogram for malignant neoplasm of breast: Secondary | ICD-10-CM

## 2015-08-13 ENCOUNTER — Ambulatory Visit
Admission: RE | Admit: 2015-08-13 | Discharge: 2015-08-13 | Disposition: A | Discharge status: Home / Self Care | Payer: 59 | Source: Ambulatory Visit

## 2015-08-13 DIAGNOSIS — Z1231 Encounter for screening mammogram for malignant neoplasm of breast: Secondary | ICD-10-CM | POA: Diagnosis not present

## 2015-08-16 MED FILL — LORazepam 0.5 MG TABS: 0.5 | 15 days supply | Qty: 30 | Fill #2

## 2015-08-30 MED FILL — CITALOPRAM HBR 20 MG TABLET: 20 | 90 days supply | Qty: 135 | Fill #1

## 2015-09-16 MED FILL — LORazepam 0.5 MG TABS: 0.5 | 15 days supply | Qty: 30 | Fill #3

## 2015-10-14 MED FILL — LORazepam 0.5 MG TABS: 0.5 | 15 days supply | Qty: 30 | Fill #4

## 2015-11-12 MED FILL — LORazepam 0.5 MG TABS: 0.5 | 15 days supply | Qty: 30 | Fill #5

## 2015-11-25 ENCOUNTER — Telehealth: Payer: Self-pay | Admitting: Family Medicine

## 2015-11-25 NOTE — Telephone Encounter (Signed)
° ° °  Pt request refill of the following: LORazepam (ATIVAN) 0.5 MG tablet   citalopram (CELEXA) 20 MG tablet     Phamacy:    [facility]   [address]

## 2015-11-26 ENCOUNTER — Other Ambulatory Visit: Payer: Self-pay | Admitting: Emergency Medicine

## 2015-11-26 DIAGNOSIS — F329 Major depressive disorder, single episode, unspecified: Secondary | ICD-10-CM

## 2015-11-26 DIAGNOSIS — F32A Depression, unspecified: Secondary | ICD-10-CM

## 2015-11-26 MED ORDER — CITALOPRAM HYDROBROMIDE 20 MG PO TABS: ORAL_TABLET | ORAL | 0 refills | Status: DC

## 2015-11-26 MED ORDER — LORAZEPAM 0.5 MG PO TABS: 0.5000 mg | ORAL_TABLET | Freq: Two times a day (BID) | ORAL | 0 refills | Status: DC | PRN

## 2015-11-26 MED FILL — LORazepam 0.5 MG TABS: 0.5 | 30 days supply | Qty: 60 | Fill #0

## 2015-11-26 MED FILL — CITALOPRAM HBR 20 MG TABLET: 20 | 30 days supply | Qty: 45 | Fill #0

## 2015-11-26 NOTE — Telephone Encounter (Signed)
Pt needs and appointment scheduled. Per Dr, Tawanna Coolerodd work her in on his schedule for sometimes this fall

## 2015-11-27 NOTE — Telephone Encounter (Signed)
Appointment scheduled 03/31/15

## 2015-12-25 MED FILL — CITALOPRAM HBR 20 MG TABLET: 20 | 30 days supply | Qty: 45 | Fill #1

## 2016-01-20 ENCOUNTER — Telehealth: Payer: Self-pay | Admitting: Family Medicine

## 2016-01-20 ENCOUNTER — Ambulatory Visit (INDEPENDENT_AMBULATORY_CARE_PROVIDER_SITE_OTHER): Payer: Managed Care, Other (non HMO) | Admitting: Family Medicine

## 2016-01-20 ENCOUNTER — Encounter: Payer: Self-pay | Admitting: Family Medicine

## 2016-01-20 VITALS — BP 116/74 | HR 77 | Temp 98.3°F | Wt 152.1 lb

## 2016-01-20 DIAGNOSIS — M779 Enthesopathy, unspecified: Secondary | ICD-10-CM

## 2016-01-20 DIAGNOSIS — M778 Other enthesopathies, not elsewhere classified: Secondary | ICD-10-CM

## 2016-01-20 DIAGNOSIS — R197 Diarrhea, unspecified: Secondary | ICD-10-CM | POA: Diagnosis not present

## 2016-01-20 MED ORDER — PREDNISONE 20 MG PO TABS: ORAL_TABLET | ORAL | 1 refills | Status: DC

## 2016-01-20 MED FILL — predniSONE 20 MG TABS: 20 | 23 days supply | Qty: 40 | Fill #0

## 2016-01-20 NOTE — Progress Notes (Signed)
Rebecca Hart is a 60 year old married female nonsmoker who comes in today with a four-week history of diarrhea  She does EKGs in the emergency room. About 4 weeks ago she developed diarrhea. She describes 2-4 bowel once a day preceded by abdominal cramping. No fever chills nausea vomiting. She had flu shot 8 hours prior to the diarrhea starting but I don't think that's related.  She's not gone on a liquid diet. She did try some Imodium but they gave her constipation. Her weight is down 12 pounds via diet and exercise.  Saturday she had diarrhea Sunday had a normal bowel movement and normal bowel movement again today  Colonoscopy 9 years ago normal  No bleeding etc. Etc.  no antibiotic use nor foreign travel Review of systems negative except for tendinitis right and left thumb. She's been treating it over-the-counter medication and ice but it's not helping. Skin: On for about a year. No history of trauma.  Physical evaluation vital signs stable she's afebrile examination the abdomen the abdomen is flat the bowel sounds are hyperactive and tinkling. There is no distention. Liver spleen kidney stone large I can appreciate no masses. There is no tenderness no rebound.  Impression #1 diarrhea prolonged......Marland Kitchen. probable rotavirus,,,,,,,,,,,, plan clear liquid diet for 2-3 days  #2 bilateral thumb tendinitis.......... short arm splints at bedtime short course of prednisone....... hand consult if symptoms do not abate

## 2016-01-20 NOTE — Telephone Encounter (Signed)
Rebecca Hart with [facility] called to clarify instructions/quantity for  predniSONE (DELTASONE) 20 MG tablet  Quantity 40 tabs with 1 refill.  Their calculations come to 14 tabs. No refill. Will you call and clarify?

## 2016-01-20 NOTE — Telephone Encounter (Signed)
Called and spoke with [facility]. Informed her that per Dr. Tawanna Coolerodd he gives extra just in case the pt has a reoccurrence. Understanding verbalized nothing further needed at this time.

## 2016-01-20 NOTE — Patient Instructions (Signed)
Prednisone 20 mg...... 2 tabs 3 days, 1 tab 3 days, half tab 3 days,...... then one half tab Monday Wednesday Friday for a two-week taper.  Short arm splints,,,,,,,, apply at bedtime remove in the morning  If your symptoms do not abate than I recommend a consult the hand center with Dr. Merlyn LotKuzma.  Clear liquid diet for 2-3 days until the diarrhea completely stops

## 2016-01-20 NOTE — Progress Notes (Signed)
Pre visit review using our clinic review tool, if applicable. No additional management support is needed unless otherwise documented below in the visit note. 

## 2016-01-23 MED FILL — CITALOPRAM HBR 20 MG TABLET: 20 | 30 days supply | Qty: 45 | Fill #2

## 2016-01-27 MED FILL — LORazepam 0.5 MG TABS: 0.5 | 30 days supply | Qty: 60 | Fill #1

## 2016-02-24 MED FILL — CITALOPRAM HBR 20 MG TABLET: 20 | 30 days supply | Qty: 45 | Fill #3

## 2016-02-25 DIAGNOSIS — K52831 Collagenous colitis: Secondary | ICD-10-CM

## 2016-02-25 HISTORY — DX: Collagenous colitis: K52.831

## 2016-02-27 MED FILL — metroNIDAZOLE 500 MG TABS: 500 | 14 days supply | Qty: 42 | Fill #0

## 2016-02-28 MED FILL — BUDESONIDE EC 3 MG CAPSULE: 3 | 30 days supply | Qty: 90 | Fill #0

## 2016-03-18 MED FILL — LORazepam 0.5 MG TABS: 0.5 | 30 days supply | Qty: 60 | Fill #2

## 2016-03-18 MED FILL — CITALOPRAM HBR 20 MG TABLET: 20 | 30 days supply | Qty: 45 | Fill #4

## 2016-03-19 ENCOUNTER — Telehealth: Payer: Self-pay | Admitting: Emergency Medicine

## 2016-03-19 NOTE — Telephone Encounter (Signed)
Left a message for pt to call the office back in regarding setting up an appointment to have labs done a week before CPE

## 2016-03-25 ENCOUNTER — Other Ambulatory Visit: Payer: Self-pay

## 2016-03-30 ENCOUNTER — Encounter: Payer: Self-pay | Admitting: Family Medicine

## 2016-03-30 ENCOUNTER — Ambulatory Visit (INDEPENDENT_AMBULATORY_CARE_PROVIDER_SITE_OTHER): Payer: Managed Care, Other (non HMO) | Admitting: Family Medicine

## 2016-03-30 ENCOUNTER — Other Ambulatory Visit (HOSPITAL_COMMUNITY)
Admission: RE | Admit: 2016-03-30 | Discharge: 2016-03-30 | Disposition: A | Discharge status: Home / Self Care | Payer: Managed Care, Other (non HMO) | Source: Ambulatory Visit | Attending: Family Medicine | Admitting: Family Medicine

## 2016-03-30 ENCOUNTER — Encounter: Payer: Self-pay | Admitting: Emergency Medicine

## 2016-03-30 VITALS — BP 120/80 | HR 71 | Temp 97.8°F | Ht 67.0 in | Wt 148.9 lb

## 2016-03-30 DIAGNOSIS — F329 Major depressive disorder, single episode, unspecified: Secondary | ICD-10-CM | POA: Diagnosis not present

## 2016-03-30 DIAGNOSIS — Z124 Encounter for screening for malignant neoplasm of cervix: Secondary | ICD-10-CM

## 2016-03-30 DIAGNOSIS — J3089 Other allergic rhinitis: Secondary | ICD-10-CM | POA: Diagnosis not present

## 2016-03-30 DIAGNOSIS — F5101 Primary insomnia: Secondary | ICD-10-CM

## 2016-03-30 DIAGNOSIS — F32A Depression, unspecified: Secondary | ICD-10-CM

## 2016-03-30 DIAGNOSIS — Z23 Encounter for immunization: Secondary | ICD-10-CM

## 2016-03-30 DIAGNOSIS — Z01419 Encounter for gynecological examination (general) (routine) without abnormal findings: Secondary | ICD-10-CM | POA: Diagnosis not present

## 2016-03-30 DIAGNOSIS — N898 Other specified noninflammatory disorders of vagina: Secondary | ICD-10-CM

## 2016-03-30 LAB — LIPID PANEL
Cholesterol: 245 mg/dL — ABNORMAL HIGH (ref 0–200)
HDL: 95.1 mg/dL (ref >39.00)
LDL Cholesterol: 131 mg/dL — ABNORMAL HIGH (ref 0–99)
NonHDL: 149.76
Total CHOL/HDL Ratio: 3
Triglycerides: 94 mg/dL (ref 0.0–149.0)
VLDL: 18.8 mg/dL (ref 0.0–40.0)

## 2016-03-30 LAB — HEPATIC FUNCTION PANEL
ALBUMIN: 4.2 g/dL (ref 3.5–5.2)
ALK PHOS: 78 U/L (ref 39–117)
ALT: 17 U/L (ref 0–35)
AST: 22 U/L (ref 0–37)
BILIRUBIN TOTAL: 0.5 mg/dL (ref 0.2–1.2)
Bilirubin, Direct: 0.1 mg/dL (ref 0.0–0.3)
Total Protein: 6.9 g/dL (ref 6.0–8.3)

## 2016-03-30 LAB — TSH: TSH: 0.69 u[IU]/mL (ref 0.35–4.50)

## 2016-03-30 MED ORDER — LORAZEPAM 0.5 MG PO TABS: 0.5000 mg | ORAL_TABLET | Freq: Two times a day (BID) | ORAL | 4 refills | Status: DC | PRN

## 2016-03-30 MED ORDER — CITALOPRAM HYDROBROMIDE 20 MG PO TABS: ORAL_TABLET | ORAL | 4 refills | Status: DC

## 2016-03-30 MED ORDER — ESTROGENS, CONJUGATED 0.625 MG/GM VA CREA: 1.0000 | TOPICAL_CREAM | Freq: Every day | VAGINAL | 12 refills | Status: DC

## 2016-03-30 NOTE — Progress Notes (Signed)
Rebecca Hart is a 61 year old married female nonsmoker who comes in today for general physical examination  She's had a difficult year physically. She developed diarrhea which turned out to be collagenous colitis. She's been on steroids and is doing much better. However in the middle of all this she developed a viral gastroenteritis and had to have IV fluids. She since [facility] [place]  [facility].  She gets routine eye care, dental care, BSE monthly, annual mammography, colonoscopy December 2017.  Vaccinations seasonal flu shot 01/02/2016 tetanus booster today. Previous tetanus was October 2006  LMP was 9 years ago. She's never had trouble with her Paps. She has central dry vagina. No hot flushes.  Medication she takes Celexa 30 mg at bedtime because history of mild depression, Claritin for allergic rhinitis, Ativan as needed twice a day for anxiety. She stopped the Naprosyn.  14 point review of systems reviewed otherwise negative except for above  .BP 120/80 (BP Location: Left Arm, Patient Position: Sitting, Cuff Size: Normal)   Pulse 71   Temp 97.8 F (36.6 C) (Oral)   Ht 5\' 7"  (1.702 m)   Wt 148 lb 14.4 oz (67.5 kg)   LMP 02/07/2010   BMI 23.32 kg/m  Examination HEENT were negative neck was supple no adenopathy thyroid normal . Cardiopulmonary exam normal breast exam normal abdominal exam normal pelvic examination external genitalia within normal limits vaginal vault was normal cervix is visualized Pap smears done bimanual exam negative rectal deferred she just had that done by her gastroenterologist. Extremities normal skin normal peripheral pulses normal  #1 healthy female  #2 collagenous colitis.......... currently being treated by her gastroenterologist  #3 postmenopausal vaginal dryness...Marland Kitchen.Marland Kitchen.Marland Kitchen. Premarin vaginal cream  #4 history of mild depression.......... continue Celexa  #5 mild anxiety...Marland Kitchen.Marland Kitchen.Marland Kitchen. Ativan 0.5 twice a day when necessary  Allergic rhinitis  Claritin 10 mg daily

## 2016-03-30 NOTE — Addendum Note (Signed)
Addended by: Dominic PeaHOLSEY, Kayslee Furey V. on: 03/30/2016 05:11 PM   Modules accepted: Orders

## 2016-03-30 NOTE — Patient Instructions (Signed)
Continue diet and exercise program  Premarin vaginal cream,,,,,,,, one applicator twice weekly,,,,,,,,, [email or web address]  Astroglide..............otc ,,,,,, vaginal lubricant

## 2016-04-02 LAB — CYTOLOGY - PAP: Diagnosis: NEGATIVE

## 2016-04-10 MED FILL — BUDESONIDE EC 3 MG CAPSULE: 3 | 30 days supply | Qty: 90 | Fill #1

## 2016-04-22 MED FILL — CITALOPRAM HBR 20 MG TABLET: 20 | 30 days supply | Qty: 45 | Fill #5

## 2016-04-22 MED FILL — LORazepam 0.5 MG TABS: 0.5 | 30 days supply | Qty: 60 | Fill #0

## 2016-05-20 MED FILL — CITALOPRAM HBR 20 MG TABLET: 20 | 30 days supply | Qty: 45 | Fill #0

## 2016-06-16 MED FILL — LORazepam 0.5 MG TABS: 0.5 | 30 days supply | Qty: 60 | Fill #1

## 2016-06-23 MED FILL — CITALOPRAM HBR 20 MG TABLET: 20 | 30 days supply | Qty: 45 | Fill #1

## 2016-06-23 MED FILL — BUDESONIDE EC 3 MG CAPSULE: 3 | 30 days supply | Qty: 90 | Fill #2 | Status: TO

## 2016-07-16 MED FILL — CITALOPRAM HBR 20 MG TABLET: 20 | 30 days supply | Qty: 45 | Fill #2

## 2016-08-04 MED FILL — LORazepam 0.5 MG TABS: 0.5 | 30 days supply | Qty: 60 | Fill #2

## 2016-08-24 MED FILL — CITALOPRAM HBR 20 MG TABLET: 20 | 30 days supply | Qty: 45 | Fill #3

## 2016-09-01 MED FILL — LORazepam 0.5 MG TABS: 0.5 | 30 days supply | Qty: 60 | Fill #3

## 2016-09-24 MED FILL — CITALOPRAM HBR 20 MG TABLET: 20 | 30 days supply | Qty: 45 | Fill #4

## 2016-09-30 ENCOUNTER — Other Ambulatory Visit: Payer: Self-pay | Admitting: Family Medicine

## 2016-10-02 ENCOUNTER — Telehealth: Payer: Self-pay

## 2016-10-02 MED FILL — LORazepam 0.5 MG TABS: 0.5 | 30 days supply | Qty: 60 | Fill #0

## 2016-10-02 NOTE — Telephone Encounter (Signed)
Rx has been printed awaiting signiture

## 2016-10-02 NOTE — Telephone Encounter (Signed)
Rx was signed and phoned in to pt pharmacy, left message for pt to call back if have any questions.

## 2016-10-14 ENCOUNTER — Ambulatory Visit (INDEPENDENT_AMBULATORY_CARE_PROVIDER_SITE_OTHER): Payer: Managed Care, Other (non HMO) | Admitting: Family Medicine

## 2016-10-14 DIAGNOSIS — M1811 Unilateral primary osteoarthritis of first carpometacarpal joint, right hand: Secondary | ICD-10-CM | POA: Diagnosis not present

## 2016-10-14 NOTE — Assessment & Plan Note (Signed)
Patient given an injection today and tolerated the procedure well. We discussed icing regimen, home exercises, which activities doing which ones to avoid. Patient will try bracing at night. Topical anti-inflammatories given. Follow-up again in 3-4 weeks. Can repeat injection every 3-4 months

## 2016-10-14 NOTE — Progress Notes (Signed)
[name], [name] Subjective:    I'm seeing this patient by the request  of:  Roderick Peeodd, Jeffrey A, MD   CC: Right thumb pain  OZH:[id number]:Subjective  Rebecca ReedyDeborah G Hart is a 61 y.o. female coming in with complaint of right thumb pain. 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 Patient states that noticing that the pain hurts more more. Seems to be at the base of it. Denies any radiation of pain, denies any numbness. Patient is not on any swelling. Does not remember injury. Rates the severity of pain a 6 out of 10. Can affect her work      Past Medical History:  Diagnosis Date  . Collagenous colitis 02/25/2016  . Depression   . IBS (irritable bowel syndrome)    No past surgical history on file. Social History   Social History  . Marital status: Married    Spouse name: N/A  . Number of children: N/A  . Years of education: N/A   Social History Main Topics  . Smoking status: Never Smoker  . Smokeless tobacco: Never Used  . Alcohol use Yes     Comment: ocassionally  . Drug use: No  . Sexual activity: Not on file   Other Topics Concern  . Not on file   Social History Narrative  . No narrative on file   No Known Allergies Family History  Problem Relation Age of Onset  . Hypertension Mother   . Lupus Sister      Past medical history, social, surgical and family history all reviewed in electronic medical record.  No pertanent information unless stated regarding to the chief complaint.   Review of Systems:Review of systems updated and as accurate as of 10/14/16  No headache, visual changes, nausea, vomiting, diarrhea, constipation, dizziness, abdominal pain, skin rash, fevers, chills, night sweats, weight loss, swollen lymph nodes, body aches, joint swelling, muscle aches, chest pain, shortness of breath, mood changes.   Objective  Last menstrual period  02/07/2010.  General: No apparent distress alert and oriented x3 mood and affect normal, dressed appropriately.  HEENT: Pupils equal, extraocular movements intact  Respiratory: Patient's speak in full sentences and does not appear short of breath  Cardiovascular: No lower extremity edema, non tender, no erythema  Skin: Warm dry intact with no signs of infection or rash on extremities or on axial skeleton.  Abdomen: Soft nontender  Neuro: Cranial nerves II through XII are intact, neurovascularly intact in all extremities with 2+ DTRs and 2+ pulses.  Lymph: No lymphadenopathy of posterior or anterior cervical chain or axillae bilaterally.  Gait normal with good balance and coordination.  MSK:  Non tender with full range of motion and good stability and symmetric strength and tone of shoulders, elbows, wrist, hip, knee and ankles bilaterally.  Hand exam shows the patient does have arthritic changes of the thumbs bilaterally. Positive grind test on the right side. 4+ to 5 strength compared to the contralateral side. Neurovascularly intact distally.  Procedure: Real-time Ultrasound Guided Injection of right CMC joint Device: GE Logiq Q7 Ultrasound guided injection is preferred based studies that show increased duration, increased effect, greater accuracy, decreased procedural pain, increased response rate, and decreased cost with ultrasound guided versus blind injection.  Verbal informed consent obtained.  Time-out conducted.  Noted no overlying erythema, induration, or other signs of local infection.  Skin prepped in a sterile fashion.  Local anesthesia: Topical Ethyl chloride.  With sterile technique and under real time ultrasound guidance: With a 25-gauge half-inch needle patient was injected with a total of 0.5 mL of 0.5% Marcaine and 0.5 mL of Kenalog 40 mg/dL  Completed without difficulty  Pain immediately resolved suggesting accurate placement of the medication.  Advised to call if  fevers/chills, erythema, induration, drainage, or persistent bleeding.  Images permanently stored and available for review in the ultrasound unit.  Impression: Technically successful ultrasound guided injection.   Impression and Recommendations:     This case required medical decision making of moderate complexity.      Note: This dictation was prepared with Dragon dictation along with smaller phrase technology. Any transcriptional errors that result from this process are unintentional.

## 2016-10-15 ENCOUNTER — Encounter: Payer: Self-pay | Admitting: Family Medicine

## 2016-10-23 MED FILL — CITALOPRAM HBR 20 MG TABLET: 20 | 30 days supply | Qty: 45 | Fill #5

## 2016-11-02 MED FILL — LORazepam 0.5 MG TABS: 0.5 | 30 days supply | Qty: 60 | Fill #1

## 2016-11-17 MED FILL — CITALOPRAM HBR 20 MG TABLET: 20 | 30 days supply | Qty: 45 | Fill #6

## 2016-11-27 ENCOUNTER — Encounter: Payer: Self-pay | Admitting: Family Medicine

## 2016-12-03 MED FILL — LORazepam 0.5 MG TABS: 0.5 | 30 days supply | Qty: 60 | Fill #2

## 2016-12-22 MED FILL — CITALOPRAM HBR 20 MG TABLET: 20 | 30 days supply | Qty: 45 | Fill #7

## 2017-01-06 ENCOUNTER — Other Ambulatory Visit: Payer: Self-pay

## 2017-01-06 ENCOUNTER — Other Ambulatory Visit: Payer: Self-pay | Admitting: Family Medicine

## 2017-01-06 MED FILL — LORazepam 0.5 MG TABS: 0.5 | 30 days supply | Qty: 60 | Fill #0

## 2017-01-06 NOTE — Telephone Encounter (Signed)
Pt Rx for Lorazepam has been called into pt pharmacy.

## 2017-01-06 NOTE — Telephone Encounter (Signed)
Please advise 

## 2017-01-20 MED FILL — CITALOPRAM HBR 20 MG TABLET: 20 | 30 days supply | Qty: 45 | Fill #8

## 2017-02-08 MED FILL — LORazepam 0.5 MG TABS: 0.5 | 30 days supply | Qty: 60 | Fill #1

## 2017-02-22 MED FILL — CITALOPRAM HBR 20 MG TABLET: 20 | 30 days supply | Qty: 45 | Fill #9

## 2017-03-10 MED FILL — LORazepam 0.5 MG TABS: 0.5 | 30 days supply | Qty: 60 | Fill #2

## 2017-03-24 MED FILL — CITALOPRAM HBR 20 MG TABLET: 20 | 30 days supply | Qty: 45 | Fill #10

## 2017-04-07 ENCOUNTER — Other Ambulatory Visit: Payer: Self-pay | Admitting: Family Medicine

## 2017-04-07 ENCOUNTER — Telehealth: Payer: Self-pay

## 2017-04-07 MED FILL — LORazepam 0.5 MG TABS: 0.5 | 30 days supply | Qty: 60 | Fill #0

## 2017-04-07 NOTE — Telephone Encounter (Signed)
Pt  Rx was ph[facility] into [facility]

## 2017-04-21 ENCOUNTER — Other Ambulatory Visit: Payer: Self-pay | Admitting: Family Medicine

## 2017-04-21 DIAGNOSIS — F329 Major depressive disorder, single episode, unspecified: Secondary | ICD-10-CM

## 2017-04-21 DIAGNOSIS — F32A Depression, unspecified: Secondary | ICD-10-CM

## 2017-04-21 MED FILL — CITALOPRAM HBR 20 MG TABLET: 20 | 30 days supply | Qty: 45 | Fill #0

## 2017-05-11 MED FILL — LORazepam 0.5 MG TABS: 0.5 | 30 days supply | Qty: 60 | Fill #1

## 2017-05-24 MED FILL — CITALOPRAM HBR 20 MG TABLET: 20 | 30 days supply | Qty: 45 | Fill #1

## 2017-06-15 MED FILL — LORazepam 0.5 MG TABS: 0.5 | 30 days supply | Qty: 60 | Fill #2

## 2017-06-23 MED FILL — CITALOPRAM HBR 20 MG TABLET: 20 | 30 days supply | Qty: 45 | Fill #2

## 2017-07-12 ENCOUNTER — Other Ambulatory Visit: Payer: Self-pay | Admitting: Family Medicine

## 2017-07-13 MED FILL — LORazepam 0.5 MG TABS: 0.5 | 30 days supply | Qty: 60 | Fill #0

## 2017-07-22 MED FILL — CITALOPRAM HBR 20 MG TABLET: 20 | 30 days supply | Qty: 45 | Fill #3

## 2017-08-11 MED FILL — LORazepam 0.5 MG TABS: 0.5 | 30 days supply | Qty: 60 | Fill #1

## 2017-08-19 MED FILL — CITALOPRAM HBR 20 MG TABLET: 20 | 30 days supply | Qty: 45 | Fill #4

## 2017-09-20 MED FILL — CITALOPRAM HBR 20 MG TABLET: 20 | 30 days supply | Qty: 45 | Fill #5

## 2017-10-22 MED FILL — CITALOPRAM HBR 20 MG TABLET: 20 | 30 days supply | Qty: 45 | Fill #6

## 2017-10-22 MED FILL — LORazepam 0.5 MG TABS: 0.5 | 30 days supply | Qty: 60 | Fill #2

## 2017-11-15 MED FILL — PREDNISOLONE AC 1% EYE DROP: 1 | 25 days supply | Qty: 10 | Fill #0

## 2017-11-15 MED FILL — MOXIFLOXACIN HCL 0.5 % SOLN: 0.5 | 15 days supply | Qty: 3 | Fill #0

## 2017-12-07 MED FILL — CITALOPRAM HBR 20 MG TABLET: 20 | 30 days supply | Qty: 45 | Fill #7

## 2017-12-27 MED FILL — MOXIFLOXACIN HCL 0.5 % SOLN: 0.5 | 15 days supply | Qty: 3 | Fill #0

## 2017-12-27 MED FILL — PREDNISOLONE AC 1% EYE DROP: 1 | 30 days supply | Qty: 10 | Fill #0

## 2019-09-25 ENCOUNTER — Other Ambulatory Visit: Payer: Self-pay | Admitting: Urology

## 2020-09-25 ENCOUNTER — Other Ambulatory Visit (HOSPITAL_BASED_OUTPATIENT_CLINIC_OR_DEPARTMENT_OTHER): Payer: Self-pay

## 2020-09-25 MED ORDER — THYROID 60 MG PO TABS: 60.0000 mg | ORAL_TABLET | Freq: Every day | ORAL | 1 refills | Status: DC

## 2020-09-25 MED ORDER — SODIUM CHLORIDE (HYPERTONIC) 5 % OP OINT: TOPICAL_OINTMENT | OPHTHALMIC | 7 refills | Status: AC

## 2020-09-25 MED ORDER — MOXIFLOXACIN HCL 0.5 % OP SOLN: 1.0000 [drp] | Freq: Four times a day (QID) | OPHTHALMIC | 1 refills | Status: AC

## 2020-09-25 MED ORDER — CITALOPRAM HYDROBROMIDE 20 MG PO TABS
ORAL_TABLET | ORAL | 1 refills | Status: DC
  Filled 2020-09-25: qty 45, 30d supply, fill #0
  Filled 2020-11-04: qty 45, 30d supply, fill #1

## 2020-09-25 MED ORDER — AMITRIPTYLINE HCL 25 MG PO TABS: 25.0000 mg | ORAL_TABLET | Freq: Every day | ORAL | 3 refills | Status: AC

## 2020-09-25 MED ORDER — PREDNISOLONE ACETATE 1 % OP SUSP: 1.0000 [drp] | Freq: Four times a day (QID) | OPHTHALMIC | 1 refills | Status: DC

## 2020-09-25 MED ORDER — AMITRIPTYLINE HCL 25 MG PO TABS
25.0000 mg | ORAL_TABLET | Freq: Every day | ORAL | 3 refills | Status: DC
  Filled 2020-09-25: qty 30, 30d supply, fill #0
  Filled 2020-11-04: qty 30, 30d supply, fill #1

## 2020-11-04 ENCOUNTER — Other Ambulatory Visit (HOSPITAL_BASED_OUTPATIENT_CLINIC_OR_DEPARTMENT_OTHER): Payer: Self-pay

## 2020-11-18 DIAGNOSIS — Z13220 Encounter for screening for lipoid disorders: Secondary | ICD-10-CM | POA: Diagnosis not present

## 2020-11-18 DIAGNOSIS — F3342 Major depressive disorder, recurrent, in full remission: Secondary | ICD-10-CM | POA: Diagnosis not present

## 2020-11-18 DIAGNOSIS — R030 Elevated blood-pressure reading, without diagnosis of hypertension: Secondary | ICD-10-CM | POA: Diagnosis not present

## 2020-11-18 DIAGNOSIS — Z0000 Encounter for general adult medical examination without abnormal findings: Secondary | ICD-10-CM | POA: Diagnosis not present

## 2020-11-18 DIAGNOSIS — Z130 Encounter for screening for diseases of the blood and blood-forming organs and certain disorders involving the immune mechanism: Secondary | ICD-10-CM | POA: Diagnosis not present

## 2020-11-18 DIAGNOSIS — Z1329 Encounter for screening for other suspected endocrine disorder: Secondary | ICD-10-CM | POA: Diagnosis not present

## 2020-11-18 DIAGNOSIS — R3989 Other symptoms and signs involving the genitourinary system: Secondary | ICD-10-CM | POA: Diagnosis not present

## 2020-12-01 ENCOUNTER — Other Ambulatory Visit (HOSPITAL_BASED_OUTPATIENT_CLINIC_OR_DEPARTMENT_OTHER): Payer: Self-pay

## 2020-12-02 ENCOUNTER — Other Ambulatory Visit (HOSPITAL_BASED_OUTPATIENT_CLINIC_OR_DEPARTMENT_OTHER): Payer: Self-pay

## 2020-12-02 MED ORDER — CITALOPRAM HYDROBROMIDE 20 MG PO TABS
30.0000 mg | ORAL_TABLET | Freq: Every day | ORAL | 1 refills | Status: DC
  Filled 2020-12-02: qty 45, 30d supply, fill #0
  Filled 2020-12-31: qty 45, 30d supply, fill #1

## 2020-12-02 MED ORDER — AMITRIPTYLINE HCL 25 MG PO TABS
ORAL_TABLET | ORAL | 3 refills | Status: DC
  Filled 2020-12-02: qty 30, 30d supply, fill #0
  Filled 2020-12-31: qty 30, 30d supply, fill #1
  Filled 2021-02-04: qty 30, 30d supply, fill #2

## 2020-12-11 ENCOUNTER — Other Ambulatory Visit (HOSPITAL_BASED_OUTPATIENT_CLINIC_OR_DEPARTMENT_OTHER): Payer: Self-pay

## 2020-12-11 MED ORDER — INFLUENZA VAC SPLIT QUAD 0.5 ML IM SUSY
PREFILLED_SYRINGE | INTRAMUSCULAR | 0 refills | Status: AC
  Filled 2020-12-11: qty 0.5, 1d supply, fill #0

## 2020-12-13 ENCOUNTER — Other Ambulatory Visit (HOSPITAL_BASED_OUTPATIENT_CLINIC_OR_DEPARTMENT_OTHER): Payer: Self-pay

## 2020-12-16 DIAGNOSIS — R030 Elevated blood-pressure reading, without diagnosis of hypertension: Secondary | ICD-10-CM | POA: Diagnosis not present

## 2021-01-01 ENCOUNTER — Other Ambulatory Visit (HOSPITAL_BASED_OUTPATIENT_CLINIC_OR_DEPARTMENT_OTHER): Payer: Self-pay

## 2021-02-04 ENCOUNTER — Other Ambulatory Visit (HOSPITAL_BASED_OUTPATIENT_CLINIC_OR_DEPARTMENT_OTHER): Payer: Self-pay

## 2021-02-05 ENCOUNTER — Other Ambulatory Visit (HOSPITAL_BASED_OUTPATIENT_CLINIC_OR_DEPARTMENT_OTHER): Payer: Self-pay

## 2021-02-05 MED ORDER — CITALOPRAM HYDROBROMIDE 20 MG PO TABS
30.0000 mg | ORAL_TABLET | Freq: Every day | ORAL | 1 refills | Status: AC
  Filled 2021-02-05: qty 45, 30d supply, fill #0

## 2022-12-17 ENCOUNTER — Ambulatory Visit: Payer: Medicare Other | Admitting: Physical Therapy

## 2022-12-24 ENCOUNTER — Ambulatory Visit: Payer: Medicare Other | Admitting: Physical Therapy

## 2022-12-30 NOTE — Therapy (Signed)
OUTPATIENT PHYSICAL THERAPY FEMALE PELVIC EVALUATION   Patient Name: Rebecca Hart MRN: [id number] DOB:06/14/1955, 67 y.o., female Today's Date: 12/31/2022  END OF SESSION:  PT End of Session - 12/31/22 1224     Visit Number 1    PT Start Time 1153    PT Stop Time 1232    PT Time Calculation (min) 39 min    Activity Tolerance No increased pain    Behavior During Therapy WFL for tasks assessed/performed             Past Medical History:  Diagnosis Date   Collagenous colitis 02/25/2016   Depression    IBS (irritable bowel syndrome)    No past surgical history on file. Patient Active Problem List   Diagnosis Date Noted   Arthritis of carpometacarpal [facility]) joint of right thumb 10/14/2016   Insomnia 08/29/2013   Allergic rhinitis 08/03/2007   Depression 06/01/2007    PCP: Roderick Pee, MD PCP - General  REFERRING PROVIDER: Cyril Mourning, FNP Ref Provider  REFERRING DIAG: N94.10 (ICD-10-CM) - Unspecified dyspareunia N39.3 (ICD-10-CM) - Stress incontinence (female) (female)  THERAPY DIAG:  Other muscle spasm  Other lack of coordination  Rationale for Evaluation and Treatment: Rehabilitation  ONSET DATE: SUI since 2009 and dyspareunia since 2021  SUBJECTIVE:                                                                                                                                                                                           SUBJECTIVE STATEMENT: Pt reports that she has 2 concerns- SUI and pain with sex. SUI for 15 years, sex 3 years ago- not dry. On the outer ring. She is not sure if she is in the right place. Never has had PT before. Used to work as an Museum/gallery exhibitions officer, retired now. Lives in [place] 5 minutes away     Fluid intake: Yes: a lot of water    PAIN:  Are you having pain? No  PRECAUTIONS: None  RED FLAGS: None   WEIGHT BEARING RESTRICTIONS: No  FALLS:  Has patient fallen in last 6 months? No  LIVING ENVIRONMENT: Lives  with: lives with their spouse Lives in: House/apartment Stairs: No Has following equipment at home: None  OCCUPATION: retired  PLOF: Independent  PATIENT GOALS: reduce pain with intercourse and SUI  PERTINENT HISTORY:   Sexual abuse: No  BOWEL MOVEMENT:- Pain with bowel movement: No Type of bowel movement:Type (Bristol Stool Scale) 1 without metamucil Fully empty rectum: Yes:   Leakage: No Pads: No Fiber supplement: Yes:    URINATION: Pain with urination: No Fully empty bladder: Yes:  Stream: Strong Urgency: No Frequency: yes depends on water intake Leakage: Coughing, Sneezing, Laughing, and Exercise Pads: Yes: -1 /day  INTERCOURSE: Pain with intercourse: Initial Penetration Ability to have vaginal penetration:  Yes:   Climax: no Marinoff Scale: 3/3  PREGNANCY: Vaginal deliveries 1 Tearing Yes:   C-section deliveries 0 Currently pregnant No  PROLAPSE: None   OBJECTIVE:  Note: Objective measures were completed at Evaluation unless otherwise noted.      COGNITION: Overall cognitive status: Within functional limits for tasks assessed     SENSATION: Light touch: Appears intact Proprioception: Appears intact  MUSCLE LENGTH: Hamstrings: Right 90 deg; Left 90 deg  LUMBAR SPECIAL TESTS:  Single leg stance test: Positive- unstable, weakness noted, dips bilat   POSTURE: rounded shoulders, lifts shoulders with breathing- unable to relax, needs VC's, TC's for DB , scoliosis    LUMBARAROM/PROM:  A/PROM A/PROM  eval  Flexion Within functional limitations   Extension   Right lateral flexion Within functional limitations   Left lateral flexion Within functional limitations   Right rotation   Left rotation    (Blank rows = not tested)  LOWER EXTREMITY ROM:  Active ROM Right eval Left eval  Hip flexion [facility]  Hip extension    Hip abduction    Hip adduction    Hip internal rotation    Hip external rotation    Knee flexion    Knee  extension    Ankle dorsiflexion    Ankle plantarflexion    Ankle inversion    Ankle eversion     (Blank rows = not tested)  LOWER EXTREMITY MMT:  MMT Right eval Left eval  Hip flexion 4/5 4/5  Hip extension    Hip abduction    Hip adduction    Hip internal rotation    Hip external rotation    Knee flexion    Knee extension    Ankle dorsiflexion    Ankle plantarflexion    Ankle inversion    Ankle eversion     PALPATION:   General  within functional limitations                 External Perineal Exam some redness noticed                             Internal Pelvic Floor tight and tender perineal scar, vaginal laxity noticed with some tissue thinning and tight and tender bulbocavernosus bilateral, more on L  Patient confirms identification and approves PT to assess internal pelvic floor and treatment Yes  PELVIC MMT:   MMT eval  Vaginal 4/5  Internal Anal Sphincter   External Anal Sphincter   Puborectalis   Diastasis Recti none  (Blank rows = not tested)        TONE: WFL   PROLAPSE: no  TODAY'S TREATMENT:  DATE: 12/31/22   EVAL see above   PATIENT EDUCATION:  Education details: perineal massage, vaginal lubricants and moisturizers, pelvic floor downtraining, DB, HEP Person educated: Patient Education method: Explanation, Demonstration, Tactile cues, Verbal cues, and Handouts Education comprehension: verbalized understanding and needs further education  HOME EXERCISE PROGRAM:  [id number]  ASSESSMENT:  CLINICAL IMPRESSION: Patient is a 67 y.o. F who was seen today for physical therapy evaluation and treatment for dyspareunia and stress urinary incontinence.She presents with tight and tender perineal scar and superficial pelvic floor muscles. Some vaginal atrophy noticed. Pt also with some bilateral hip and core weakness and she will  benefit from PT to improve core strength to reduce SUI.  OBJECTIVE IMPAIRMENTS: decreased coordination, decreased mobility, decreased ROM, decreased strength, increased fascial restrictions, increased muscle spasms, and pain.   ACTIVITY LIMITATIONS: toileting  PARTICIPATION LIMITATIONS: interpersonal relationship  PERSONAL FACTORS: Time since onset of injury/illness/exacerbation are also affecting patient's functional outcome.   REHAB POTENTIAL: Good  CLINICAL DECISION MAKING: Stable/uncomplicated  EVALUATION COMPLEXITY: Low   GOALS: Goals reviewed with patient? Yes  SHORT TERM GOALS: Target date: 01/28/2023    Pt will report reduced pain with perineal massage to max 3/10 Baseline: Goal status: INITIAL  2.  Pt will be ind with using dilators for reducing pelvic floor tension and pain management  Baseline:  Goal status: INITIAL  3.  Pt will demonstrate increase in all impaired hip strength by 1 muscle grades in order to demonstrate improved lumbopelvic support and increase functional ability.   Baseline:  Goal status: INITIAL  4.  Pt will be independent with HEP.   Baseline:  Goal status: INITIAL   LONG TERM GOALS: Target date: 02/25/2023    Pt will report 0/10 pain with vaginal penetration in order to improve intimate relationship with partner.    Baseline:  Goal status: INITIAL  2.  Pt will soak 0 pads/ day Baseline:  Goal status: INITIAL  3.  Pt to be I with advanced HEP.  Baseline:  Goal status: INITIAL   PLAN:  PT FREQUENCY: 1-2x/week  PT DURATION: 8 weeks  PLANNED INTERVENTIONS: 97110-Therapeutic exercises, 97530- Therapeutic activity, 97112- Neuromuscular re-education, 97535- Self Care, 51884- Manual therapy, Dry Needling, Joint mobilization, Joint manipulation, Spinal manipulation, Spinal mobilization, Scar mobilization, and Moist heat  PLAN FOR NEXT SESSION: cont hip stretches and strengthening   Remmie Bembenek, PT 12/31/2022, 12:57 PM

## 2022-12-31 ENCOUNTER — Ambulatory Visit: Payer: Medicare Other | Attending: Registered Nurse | Admitting: Physical Therapy

## 2022-12-31 ENCOUNTER — Other Ambulatory Visit: Payer: Self-pay

## 2022-12-31 DIAGNOSIS — R278 Other lack of coordination: Secondary | ICD-10-CM | POA: Insufficient documentation

## 2022-12-31 DIAGNOSIS — N393 Stress incontinence (female) (male): Secondary | ICD-10-CM | POA: Insufficient documentation

## 2022-12-31 DIAGNOSIS — N941 Unspecified dyspareunia: Secondary | ICD-10-CM | POA: Insufficient documentation

## 2022-12-31 DIAGNOSIS — M62838 Other muscle spasm: Secondary | ICD-10-CM | POA: Insufficient documentation

## 2023-01-07 ENCOUNTER — Ambulatory Visit: Payer: Medicare Other | Admitting: Physical Therapy

## 2023-01-07 DIAGNOSIS — M62838 Other muscle spasm: Secondary | ICD-10-CM

## 2023-01-07 DIAGNOSIS — N941 Unspecified dyspareunia: Secondary | ICD-10-CM | POA: Diagnosis not present

## 2023-01-07 DIAGNOSIS — R278 Other lack of coordination: Secondary | ICD-10-CM

## 2023-01-07 NOTE — Therapy (Signed)
OUTPATIENT PHYSICAL THERAPY FEMALE PELVIC EVALUATION   Patient Name: Rebecca Hart MRN: [id number] DOB:[birth year], 67 y.o., female Today's Date: 01/07/2023  END OF SESSION:  PT End of Session - 01/07/23 1203     Visit Number 2    PT Start Time 1140    PT Stop Time 1225    PT Time Calculation (min) 45 min    Activity Tolerance No increased pain    Behavior During Therapy WFL for tasks assessed/performed              Past Medical History:  Diagnosis Date   Collagenous colitis 02/25/2016   Depression    IBS (irritable bowel syndrome)    No past surgical history on file. Patient Active Problem List   Diagnosis Date Noted   Arthritis of carpometacarpal [facility]) joint of right thumb 10/14/2016   Insomnia 08/29/2013   Allergic rhinitis 08/03/2007   Depression 06/01/2007    PCP: Roderick Pee, MD PCP - General  REFERRING PROVIDER: Cyril Mourning, FNP Ref Provider  REFERRING DIAG: N94.10 (ICD-10-CM) - Unspecified dyspareunia N39.3 (ICD-10-CM) - Stress incontinence (female) (female)  THERAPY DIAG:  Other lack of coordination  Other muscle spasm  Rationale for Evaluation and Treatment: Rehabilitation  ONSET DATE: SUI since 2009 and dyspareunia since 2021  SUBJECTIVE:                                                                                                                                                                                           SUBJECTIVE STATEMENT: Pt report that she was not able to do perineal massage last week, but she will.  She does weight training on Tuesdays with her husband.     Pt reports that she has 2 concerns- SUI and pain with sex. SUI for 15 years, sex 3 years ago- not dry. On the outer ring. She is not sure if she is in the right place. Never has had PT before. Used to work as an Museum/gallery exhibitions officer, retired now. Lives in [place] 5 minutes away     Fluid intake: Yes: a lot of water    PAIN:  Are you having pain? No  PRECAUTIONS:  None  RED FLAGS: None   WEIGHT BEARING RESTRICTIONS: No  FALLS:  Has patient fallen in last 6 months? No  LIVING ENVIRONMENT: Lives with: lives with their spouse Lives in: House/apartment Stairs: No Has following equipment at home: None  OCCUPATION: retired  PLOF: Independent  PATIENT GOALS: reduce pain with intercourse and SUI  PERTINENT HISTORY:   Sexual abuse: No  BOWEL MOVEMENT:- Pain with bowel movement: No Type of bowel movement:Type [employer]  Stool Scale) 1 without metamucil Fully empty rectum: Yes:   Leakage: No Pads: No Fiber supplement: Yes:    URINATION: Pain with urination: No Fully empty bladder: Yes:   Stream: Strong Urgency: No Frequency: yes depends on water intake Leakage: Coughing, Sneezing, Laughing, and Exercise Pads: Yes: -1 /day  INTERCOURSE: Pain with intercourse: Initial Penetration Ability to have vaginal penetration:  Yes:   Climax: no Marinoff Scale: 3/3  PREGNANCY: Vaginal deliveries 1 Tearing Yes:   C-section deliveries 0 Currently pregnant No  PROLAPSE: None   OBJECTIVE:  Note: Objective measures were completed at Evaluation unless otherwise noted.      COGNITION: Overall cognitive status: Within functional limits for tasks assessed     SENSATION: Light touch: Appears intact Proprioception: Appears intact  MUSCLE LENGTH: Hamstrings: Right 90 deg; Left 90 deg  LUMBAR SPECIAL TESTS:  Single leg stance test: Positive- unstable, weakness noted, dips bilat   POSTURE: rounded shoulders, lifts shoulders with breathing- unable to relax, needs VC's, TC's for DB , scoliosis    LUMBARAROM/PROM:  A/PROM A/PROM  eval  Flexion Within functional limitations   Extension   Right lateral flexion Within functional limitations   Left lateral flexion Within functional limitations   Right rotation   Left rotation    (Blank rows = not tested)  LOWER EXTREMITY ROM:  Active ROM Right eval Left eval  Hip flexion  [facility]  Hip extension    Hip abduction    Hip adduction    Hip internal rotation 75% 75%  Hip external rotation 75% 75%  Knee flexion    Knee extension    Ankle dorsiflexion    Ankle plantarflexion    Ankle inversion    Ankle eversion     (Blank rows = not tested)  LOWER EXTREMITY MMT:  MMT Right eval Left eval  Hip flexion 4/5 4/5  Hip extension    Hip abduction 4/5 4/5  Hip adduction 4/5 4/5  Hip internal rotation    Hip external rotation    Knee flexion    Knee extension    Ankle dorsiflexion    Ankle plantarflexion    Ankle inversion    Ankle eversion     PALPATION:   General  within functional limitations                 External Perineal Exam some redness noticed                             Internal Pelvic Floor tight and tender perineal scar, vaginal laxity noticed with some tissue thinning and tight and tender bulbocavernosus bilateral, more on L  Patient confirms identification and approves PT to assess internal pelvic floor and treatment Yes  PELVIC MMT:   MMT eval  Vaginal 4/5  Internal Anal Sphincter   External Anal Sphincter   Puborectalis   Diastasis Recti none  (Blank rows = not tested)        TONE: low tone abdomen   PROLAPSE: no  TODAY'S TREATMENT:  DATE: 01/07/23   EVAL see above     Neuro reed- flys with #7 with TRANSVERSE ABDOMINIS BREATH  Hip add with ball with TRANSVERSE ABDOMINIS BREATH Rowing with TRANSVERSE ABDOMINIS BREATH  TRANSVERSE ABDOMINIS BREATH on noodle with cough      PATIENT EDUCATION:  Education details: perineal massage, vaginal lubricants and moisturizers, pelvic floor downtraining, DB, HEP Person educated: Patient Education method: Explanation, Demonstration, Tactile cues, Verbal cues, and Handouts Education comprehension: verbalized understanding and needs further  education  HOME EXERCISE PROGRAM:  [id number]  ASSESSMENT: Pt able to coordinate her breath and pelvic floor and abdomen better today. Discussed goals and consistency with HEP as well as incorporating better breathing strategies in her sessions with trainer.   CLINICAL IMPRESSION: Patient is a 67 y.o. F who was seen today for physical therapy evaluation and treatment for dyspareunia and stress urinary incontinence.She presents with tight and tender perineal scar and superficial pelvic floor muscles. Some vaginal atrophy noticed. Pt also with some bilateral hip and core weakness and she will benefit from PT to improve core strength to reduce SUI.  OBJECTIVE IMPAIRMENTS: decreased coordination, decreased mobility, decreased ROM, decreased strength, increased fascial restrictions, increased muscle spasms, and pain.   ACTIVITY LIMITATIONS: toileting  PARTICIPATION LIMITATIONS: interpersonal relationship  PERSONAL FACTORS: Time since onset of injury/illness/exacerbation are also affecting patient's functional outcome.   REHAB POTENTIAL: Good  CLINICAL DECISION MAKING: Stable/uncomplicated  EVALUATION COMPLEXITY: Low   GOALS: Goals reviewed with patient? Yes  SHORT TERM GOALS: Target date: 01/28/2023    Pt will report reduced pain with perineal massage to max 3/10 Baseline: Goal status: INITIAL  2.  Pt will be ind with using dilators for reducing pelvic floor tension and pain management  Baseline:  Goal status: INITIAL  3.  Pt will demonstrate increase in all impaired hip strength by 1 muscle grades in order to demonstrate improved lumbopelvic support and increase functional ability.   Baseline:  Goal status: INITIAL  4.  Pt will be independent with HEP.   Baseline:  Goal status: INITIAL   LONG TERM GOALS: Target date: 02/25/2023    Pt will report 0/10 pain with vaginal penetration in order to improve intimate relationship with partner.    Baseline:  Goal status:  INITIAL  2.  Pt will soak 0 pads/ day Baseline:  Goal status: INITIAL  3.  Pt to be I with advanced HEP.  Baseline:  Goal status: INITIAL   PLAN:  PT FREQUENCY: 1-2x/week  PT DURATION: 8 weeks  PLANNED INTERVENTIONS: 97110-Therapeutic exercises, 97530- Therapeutic activity, 97112- Neuromuscular re-education, 97535- Self Care, 13086- Manual therapy, Dry Needling, Joint mobilization, Joint manipulation, Spinal manipulation, Spinal mobilization, Scar mobilization, and Moist heat  PLAN FOR NEXT SESSION: cont hip stretches and strengthening   Mikkel Charrette, PT 01/07/2023, 12:05 PM

## 2023-01-12 ENCOUNTER — Ambulatory Visit: Payer: Medicare Other | Attending: Registered Nurse | Admitting: Physical Therapy

## 2023-01-12 DIAGNOSIS — M62838 Other muscle spasm: Secondary | ICD-10-CM | POA: Diagnosis present

## 2023-01-12 DIAGNOSIS — R278 Other lack of coordination: Secondary | ICD-10-CM | POA: Diagnosis present

## 2023-01-12 NOTE — Therapy (Signed)
OUTPATIENT PHYSICAL THERAPY FEMALE PELVIC EVALUATION   Patient Name: Rebecca Hart MRN: [id number] DOB:[birth year], 67 y.o., female Today's Date: 01/12/2023  END OF SESSION:  PT End of Session - 01/12/23 0852     Visit Number 3    PT Start Time 0850    PT Stop Time 0925    PT Time Calculation (min) 35 min    Activity Tolerance No increased pain    Behavior During Therapy [facility] for tasks assessed/performed               Past Medical History:  Diagnosis Date   Collagenous colitis 02/25/2016   Depression    IBS (irritable bowel syndrome)    No past surgical history on file. Patient Active Problem List   Diagnosis Date Noted   Arthritis of carpometacarpal [facility]) joint of right thumb 10/14/2016   Insomnia 08/29/2013   Allergic rhinitis 08/03/2007   Depression 06/01/2007    PCP: Roderick Pee, MD PCP - General  REFERRING PROVIDER: Cyril Mourning, FNP Ref Provider  REFERRING DIAG: N94.10 (ICD-10-CM) - Unspecified dyspareunia N39.3 (ICD-10-CM) - Stress incontinence (female) (female)  THERAPY DIAG:  Other lack of coordination  Other muscle spasm  Rationale for Evaluation and Treatment: Rehabilitation  ONSET DATE: SUI since 2009 and dyspareunia since 2021  SUBJECTIVE:                                                                                                                                                                                           SUBJECTIVE STATEMENT: Pt reports that exercises are going well.  Pt reports that she has not done dry needling before but wants to try it for her pelvic floor today.   Pt report that she was not able to do perineal massage last week, but she will.  She does weight training on Tuesdays with her husband.     Pt reports that she has 2 concerns- SUI and pain with sex. SUI for 15 years, sex 3 years ago- not dry. On the outer ring. She is not sure if she is in the right place. Never has had PT before. Used to  work as an Museum/gallery exhibitions officer, retired now. Lives in [place] 5 minutes away     Fluid intake: Yes: a lot of water    PAIN:  Are you having pain? No  PRECAUTIONS: None  RED FLAGS: None   WEIGHT BEARING RESTRICTIONS: No  FALLS:  Has patient fallen in last 6 months? No  LIVING ENVIRONMENT: Lives with: lives with their spouse Lives in: House/apartment Stairs: No Has following equipment at home: None  OCCUPATION: retired  PLOF:  Independent  PATIENT GOALS: reduce pain with intercourse and SUI  PERTINENT HISTORY:   Sexual abuse: No  BOWEL MOVEMENT:- Pain with bowel movement: No Type of bowel movement:Type (Bristol Stool Scale) 1 without metamucil Fully empty rectum: Yes:   Leakage: No Pads: No Fiber supplement: Yes:    URINATION: Pain with urination: No Fully empty bladder: Yes:   Stream: Strong Urgency: No Frequency: yes depends on water intake Leakage: Coughing, Sneezing, Laughing, and Exercise Pads: Yes: -1 /day  INTERCOURSE: Pain with intercourse: Initial Penetration Ability to have vaginal penetration:  Yes:   Climax: no Marinoff Scale: 3/3  PREGNANCY: Vaginal deliveries 1 Tearing Yes:   C-section deliveries 0 Currently pregnant No  PROLAPSE: None   OBJECTIVE:  Note: Objective measures were completed at Evaluation unless otherwise noted.      COGNITION: Overall cognitive status: Within functional limits for tasks assessed     SENSATION: Light touch: Appears intact Proprioception: Appears intact  MUSCLE LENGTH: Hamstrings: Right 90 deg; Left 90 deg  LUMBAR SPECIAL TESTS:  Single leg stance test: Positive- unstable, weakness noted, dips bilat   POSTURE: rounded shoulders, lifts shoulders with breathing- unable to relax, needs VC's, TC's for DB , scoliosis    LUMBARAROM/PROM:  A/PROM A/PROM  eval  Flexion Within functional limitations   Extension   Right lateral flexion Within functional limitations   Left lateral flexion Within functional  limitations   Right rotation   Left rotation    (Blank rows = not tested)  LOWER EXTREMITY ROM:  Active ROM Right eval Left eval  Hip flexion [facility]  Hip extension    Hip abduction    Hip adduction    Hip internal rotation 75% 75%  Hip external rotation 75% 75%  Knee flexion    Knee extension    Ankle dorsiflexion    Ankle plantarflexion    Ankle inversion    Ankle eversion     (Blank rows = not tested)  LOWER EXTREMITY MMT:  MMT Right eval Left eval  Hip flexion 4/5 4/5  Hip extension    Hip abduction 4/5 4/5  Hip adduction 4/5 4/5  Hip internal rotation    Hip external rotation    Knee flexion    Knee extension    Ankle dorsiflexion    Ankle plantarflexion    Ankle inversion    Ankle eversion     PALPATION:   General  within functional limitations                 External Perineal Exam some redness noticed                             Internal Pelvic Floor tight and tender perineal scar, vaginal laxity noticed with some tissue thinning and tight and tender bulbocavernosus bilateral, more on L  Patient confirms identification and approves PT to assess internal pelvic floor and treatment Yes  PELVIC MMT:   MMT eval  Vaginal 4/5  Internal Anal Sphincter   External Anal Sphincter   Puborectalis   Diastasis Recti none  (Blank rows = not tested)        TONE: low tone abdomen   PROLAPSE: no  TODAY'S TREATMENT:  DATE: 01/12/23   EVAL see above Manual- trial of pelvic floor dry needling, manual pelvic floor release        Neuro reed- flys with #7 with TRANSVERSE ABDOMINIS BREATH  Hip add with ball with TRANSVERSE ABDOMINIS BREATH Rowing with TRANSVERSE ABDOMINIS BREATH  TRANSVERSE ABDOMINIS BREATH on noodle with cough      PATIENT EDUCATION:  Education details: perineal massage, vaginal lubricants and  moisturizers, pelvic floor downtraining, DB, HEP Person educated: Patient Education method: Explanation, Demonstration, Tactile cues, Verbal cues, and Handouts Education comprehension: verbalized understanding and needs further education  HOME EXERCISE PROGRAM:  [id number]  ASSESSMENT: Tx focus- pelvic floor dry needling and manual release. Pt able to [facility] good pelvic floor contraction and lengthening now, learning what AROM PF feels like. Minimal prolapse in standing noticed. Did great with trial of needling and reported immediate relief.  Trigger Point Dry-Needling  Treatment instructions: Expect mild to moderate muscle soreness. S/S of pneumothorax if dry needled over a lung field, and to seek immediate medical attention should they occur. Patient verbalized understanding of these instructions and education.  Patient Consent Given: Yes Education handout provided: Yes Muscles treated: perineal body and bulbo bilat Treatment response/outcome: Utilized skilled palpation to identify trigger points.  During dry needling able to palpate muscle twitch and muscle elongation   Skilled palpation and monitoring by PT during dry needling    Pt able to coordinate her breath and pelvic floor and abdomen better today. Discussed goals and consistency with HEP as well as incorporating better breathing strategies in her sessions with trainer.   CLINICAL IMPRESSION: Patient is a 67 y.o. F who was seen today for physical therapy evaluation and treatment for dyspareunia and stress urinary incontinence.She presents with tight and tender perineal scar and superficial pelvic floor muscles. Some vaginal atrophy noticed. Pt also with some bilateral hip and core weakness and she will benefit from PT to improve core strength to reduce SUI.  OBJECTIVE IMPAIRMENTS: decreased coordination, decreased mobility, decreased ROM, decreased strength, increased fascial restrictions, increased muscle spasms, and pain.    ACTIVITY LIMITATIONS: toileting  PARTICIPATION LIMITATIONS: interpersonal relationship  PERSONAL FACTORS: Time since onset of injury/illness/exacerbation are also affecting patient's functional outcome.   REHAB POTENTIAL: Good  CLINICAL DECISION MAKING: Stable/uncomplicated  EVALUATION COMPLEXITY: Low   GOALS: Goals reviewed with patient? Yes  SHORT TERM GOALS: Target date: 01/28/2023    Pt will report reduced pain with perineal massage to max 3/10 Baseline: Goal status: INITIAL  2.  Pt will be ind with using dilators for reducing pelvic floor tension and pain management  Baseline:  Goal status: INITIAL  3.  Pt will demonstrate increase in all impaired hip strength by 1 muscle grades in order to demonstrate improved lumbopelvic support and increase functional ability.   Baseline:  Goal status: INITIAL  4.  Pt will be independent with HEP.   Baseline:  Goal status: INITIAL   LONG TERM GOALS: Target date: 02/25/2023    Pt will report 0/10 pain with vaginal penetration in order to improve intimate relationship with partner.    Baseline:  Goal status: INITIAL  2.  Pt will soak 0 pads/ day Baseline:  Goal status: INITIAL  3.  Pt to be I with advanced HEP.  Baseline:  Goal status: INITIAL   PLAN:  PT FREQUENCY: 1-2x/week  PT DURATION: 8 weeks  PLANNED INTERVENTIONS: 97110-Therapeutic exercises, 97530- Therapeutic activity, 97112- Neuromuscular re-education, 97535- Self Care, 54098- Manual therapy, Dry Needling, Joint mobilization, Joint manipulation, Spinal  manipulation, Spinal mobilization, Scar mobilization, and Moist heat  PLAN FOR NEXT SESSION: cont hip stretches and strengthening   Lisle Skillman, PT 01/12/2023, 9:18 AM

## 2023-01-19 ENCOUNTER — Ambulatory Visit: Payer: Medicare Other | Admitting: Physical Therapy

## 2023-01-19 DIAGNOSIS — R278 Other lack of coordination: Secondary | ICD-10-CM | POA: Diagnosis not present

## 2023-01-19 DIAGNOSIS — M62838 Other muscle spasm: Secondary | ICD-10-CM

## 2023-01-19 NOTE — Therapy (Signed)
OUTPATIENT PHYSICAL THERAPY FEMALE PELVIC EVALUATION   Patient Name: Rebecca Hart MRN: [id number] DOB:September 11, 1955, 67 y.o., female Today's Date: 01/19/2023  END OF SESSION:  PT End of Session - 01/19/23 1516     Visit Number 4    PT Start Time 1445    PT Stop Time 1515    PT Time Calculation (min) 30 min    Activity Tolerance No increased pain    Behavior During Therapy WFL for tasks assessed/performed                Past Medical History:  Diagnosis Date   Collagenous colitis 02/25/2016   Depression    IBS (irritable bowel syndrome)    No past surgical history on file. Patient Active Problem List   Diagnosis Date Noted   Arthritis of carpometacarpal [facility]) joint of right thumb 10/14/2016   Insomnia 08/29/2013   Allergic rhinitis 08/03/2007   Depression 06/01/2007    PCP: Roderick Pee, MD PCP - General  REFERRING PROVIDER: Cyril Mourning, FNP Ref Provider  REFERRING DIAG: N94.10 (ICD-10-CM) - Unspecified dyspareunia N39.3 (ICD-10-CM) - Stress incontinence (female) (female)  THERAPY DIAG:  Other lack of coordination  Other muscle spasm  Rationale for Evaluation and Treatment: Rehabilitation  ONSET DATE: SUI since 2009 and dyspareunia since 2021  SUBJECTIVE:                                                                                                                                                                                           SUBJECTIVE STATEMENT: Pt reports that she is stretching every day. Felt good after needling.  Is able to use her vibrator, that she has never been able to use ( similar to IR size 8) Uses a lubricant. Still burns It has been 2 years since she had pain free sex. She uses slippery stuff, [place], SUI- is doing her exercises.  It has not been quite as bad, it will just take time. Reports that she takes a lot of water. Trained herself to go to the bathroom. Husband has been out of town for [employer] work , home  only 2 days/ week. He is back now.     Pt reports that exercises are going well.  Pt reports that she has not done dry needling before but wants to try it for her pelvic floor today.   Pt report that she was not able to do perineal massage last week, but she will.  She does weight training on Tuesdays with her husband.     Pt reports that she has 2 concerns- SUI and pain with sex. SUI for 15  years, sex 3 years ago- not dry. On the outer ring. She is not sure if she is in the right place. Never has had PT before. Used to work as an Museum/gallery exhibitions officer, retired now. Lives in [place] 5 minutes away     Fluid intake: Yes: a lot of water    PAIN:  Are you having pain? No  PRECAUTIONS: None  RED FLAGS: None   WEIGHT BEARING RESTRICTIONS: No  FALLS:  Has patient fallen in last 6 months? No  LIVING ENVIRONMENT: Lives with: lives with their spouse Lives in: House/apartment Stairs: No Has following equipment at home: None  OCCUPATION: retired  PLOF: Independent  PATIENT GOALS: reduce pain with intercourse and SUI  PERTINENT HISTORY:   Sexual abuse: No  BOWEL MOVEMENT:- Pain with bowel movement: No Type of bowel movement:Type (Bristol Stool Scale) 1 without metamucil Fully empty rectum: Yes:   Leakage: No Pads: No Fiber supplement: Yes:    URINATION: Pain with urination: No Fully empty bladder: Yes:   Stream: Strong Urgency: No Frequency: yes depends on water intake Leakage: Coughing, Sneezing, Laughing, and Exercise Pads: Yes: -1 /day  INTERCOURSE: Pain with intercourse: Initial Penetration Ability to have vaginal penetration:  Yes:   Climax: no Marinoff Scale: 3/3  PREGNANCY: Vaginal deliveries 1 Tearing Yes:   C-section deliveries 0 Currently pregnant No  PROLAPSE: None   OBJECTIVE:  Note: Objective measures were completed at Evaluation unless otherwise noted.      COGNITION: Overall cognitive status: Within functional limits for tasks  assessed     SENSATION: Light touch: Appears intact Proprioception: Appears intact  MUSCLE LENGTH: Hamstrings: Right 90 deg; Left 90 deg  LUMBAR SPECIAL TESTS:  Single leg stance test: Positive- unstable, weakness noted, dips bilat   POSTURE: rounded shoulders, lifts shoulders with breathing- unable to relax, needs VC's, TC's for DB , scoliosis    LUMBARAROM/PROM:  A/PROM A/PROM  eval  Flexion Within functional limitations   Extension   Right lateral flexion Within functional limitations   Left lateral flexion Within functional limitations   Right rotation   Left rotation    (Blank rows = not tested)  LOWER EXTREMITY ROM:  Active ROM Right eval Left eval  Hip flexion [facility]  Hip extension    Hip abduction    Hip adduction    Hip internal rotation 75% 75%  Hip external rotation 75% 75%  Knee flexion    Knee extension    Ankle dorsiflexion    Ankle plantarflexion    Ankle inversion    Ankle eversion     (Blank rows = not tested)  LOWER EXTREMITY MMT:  MMT Right eval Left eval  Hip flexion 4/5 4/5  Hip extension    Hip abduction 4/5 4/5  Hip adduction 4/5 4/5  Hip internal rotation    Hip external rotation    Knee flexion    Knee extension    Ankle dorsiflexion    Ankle plantarflexion    Ankle inversion    Ankle eversion     PALPATION:   General  within functional limitations                 External Perineal Exam some redness noticed                             Internal Pelvic Floor tight and tender perineal scar, vaginal laxity noticed with some tissue thinning and tight and tender bulbocavernosus  bilateral, more on L  Patient confirms identification and approves PT to assess internal pelvic floor and treatment Yes  PELVIC MMT:   MMT eval  Vaginal 4/5  Internal Anal Sphincter   External Anal Sphincter   Puborectalis   Diastasis Recti none  (Blank rows = not tested)        TONE: low tone abdomen   PROLAPSE: no  TODAY'S  TREATMENT:                                                                                                                              DATE: 01/19/23   Therapeutic exercise- bridging with TRA breath, SLR  Therapeutic activity- review of progress, relevant anatomy, lubricants, dilators/ vibrators use   EVAL see above Manual- trial of pelvic floor dry needling, manual pelvic floor release        Neuro reed- flys with #7 with TRANSVERSE ABDOMINIS BREATH  Hip add with ball with TRANSVERSE ABDOMINIS BREATH Rowing with TRANSVERSE ABDOMINIS BREATH  TRANSVERSE ABDOMINIS BREATH on noodle with cough      PATIENT EDUCATION:  Education details: perineal massage, vaginal lubricants and moisturizers, pelvic floor downtraining, DB, HEP Person educated: Patient Education method: Explanation, Demonstration, Tactile cues, Verbal cues, and Handouts Education comprehension: verbalized understanding and needs further education  HOME EXERCISE PROGRAM:  [id number]  ASSESSMENT: Pt seems to be progressing well, using her vibrator, progressing with her stretches. SUI still the same. Her exercise focus has been on dyspareunia. Feels like she has the tools, will try intercourse with husband who has been out of town and see if she needs more DN. Has some right hip weakness and tightness but no pain.    Tx focus- pelvic floor dry needling and manual release. Pt able to [facility] good pelvic floor contraction and lengthening now, learning what AROM PF feels like. Minimal prolapse in standing noticed. Did great with trial of needling and reported immediate relief.  Trigger Point Dry-Needling  Treatment instructions: Expect mild to moderate muscle soreness. S/S of pneumothorax if dry needled over a lung field, and to seek immediate medical attention should they occur. Patient verbalized understanding of these instructions and education.  Patient Consent Given: Yes Education handout provided: Yes Muscles  treated: perineal body and bulbo bilat Treatment response/outcome: Utilized skilled palpation to identify trigger points.  During dry needling able to palpate muscle twitch and muscle elongation   Skilled palpation and monitoring by PT during dry needling    Pt able to coordinate her breath and pelvic floor and abdomen better today. Discussed goals and consistency with HEP as well as incorporating better breathing strategies in her sessions with trainer.   CLINICAL IMPRESSION: Patient is a 67 y.o. F who was seen today for physical therapy evaluation and treatment for dyspareunia and stress urinary incontinence.She presents with tight and tender perineal scar and superficial pelvic floor muscles. Some vaginal atrophy noticed. Pt also with some bilateral hip and core weakness and  she will benefit from PT to improve core strength to reduce SUI.  OBJECTIVE IMPAIRMENTS: decreased coordination, decreased mobility, decreased ROM, decreased strength, increased fascial restrictions, increased muscle spasms, and pain.   ACTIVITY LIMITATIONS: toileting  PARTICIPATION LIMITATIONS: interpersonal relationship  PERSONAL FACTORS: Time since onset of injury/illness/exacerbation are also affecting patient's functional outcome.   REHAB POTENTIAL: Good  CLINICAL DECISION MAKING: Stable/uncomplicated  EVALUATION COMPLEXITY: Low   GOALS: Goals reviewed with patient? Yes  SHORT TERM GOALS: Target date: 01/28/2023    Pt will report reduced pain with perineal massage to max 3/10 Baseline: Goal status: 4/10 with large dilator  2.  Pt will be ind with using dilators for reducing pelvic floor tension and pain management  Baseline:  Goal status: met  3.  Pt will demonstrate increase in all impaired hip strength by 1 muscle grades in order to demonstrate improved lumbopelvic support and increase functional ability.   Baseline:  Goal status: INITIAL  4.  Pt will be independent with HEP.   Baseline:   Goal status: met  LONG TERM GOALS: Target date: 02/25/2023    Pt will report 0/10 pain with vaginal penetration in order to improve intimate relationship with partner.    Baseline:  Goal status: progressing  2.  Pt will soak 0 pads/ day Baseline:  Goal status: not met  3.  Pt to be I with advanced HEP.  Baseline:  Goal status: INITIAL   PLAN:  PT FREQUENCY: 1-2x/week  PT DURATION: 8 weeks  PLANNED INTERVENTIONS: 97110-Therapeutic exercises, 97530- Therapeutic activity, 97112- Neuromuscular re-education, 97535- Self Care, [id number]- Manual therapy, Dry Needling, Joint mobilization, Joint manipulation, Spinal manipulation, Spinal mobilization, Scar mobilization, and Moist heat  PLAN FOR NEXT SESSION: cont hip stretches and strengthening  Maruice Pieroni, PT 01/19/23 3:20 PM

## 2023-01-26 ENCOUNTER — Ambulatory Visit: Payer: Medicare Other | Admitting: Physical Therapy

## 2023-01-26 DIAGNOSIS — R278 Other lack of coordination: Secondary | ICD-10-CM

## 2023-01-26 NOTE — Patient Instructions (Signed)
  Lubrication Used for intercourse to reduce friction Avoid ones that have glycerin, nonoxynol-9, petroleum, propylene glycol, chlorhexidine gluconate, warming gels, tingling gels, icing or cooling gel, scented Avoid parabens due to a preservative similar to female sex hormone May need to be reapplied once or several times during sexual activity Can be applied to both partners genitals prior to vaginal penetration to minimize friction or irritation Prevent irritation and mucosal tears that cause post coital pain and increased the risk of vaginal and urinary tract infections Oil-based lubricants cannot be used with condoms due to breaking them down.  Least likely to irritate vaginal tissue.  Plant based-lubes are safe Silicone-based lubrication are thicker and last long and used for post-menopausal women  Vaginal Lubricators Here is a list of some suggested lubricators you can use for intercourse. Use the most hypoallergenic product.  You can place on you or your partner.  Slippery Stuff ( water based) Sylk or Sliquid Natural H2O ( good  if frequent UTI's)- walmart, amazon Sliquid organics silk-(aloe and silicone based ) [employer] (www.blossom-organics.com)- (aloe based ) Coconut oil, olive oil -not good with condoms  PJur Woman Nude- (water based) amazon Uberlube- ( silicon) Amazon Aloe Vera- Sprouts has an organic one Yes lubricant- (water based and has plant oil based similar to silicone) [employer] Platinum-Silicone, Target, [facility] Olive and Bee intimate cream-  www.oliveandbee.com.au Pink - [employer] Erosense Sync- walmart, amazon Coconu- coconu.com [employer] Good Clean Love lubricants  Things to avoid in lubricants are glycerin, warming gels, tingling gels, icing or cooling  gels, and scented gels.  Also avoid Vaseline. KY jelly,  and Astroglide contain chlorhexidine which kills good bacteria(lactobacilli)  Things to avoid in the vaginal area Do not use  things to irritate the vulvar area No lotions- see below Soaps you  can use :Aveeno, [place], Good Clean Love cleanser if needed. Must be gentle No deodorants No douches Good to sleep without underwear to let the vaginal area to air out No scrubbing: spread the lips to let warm water rinse over labias and pat dry  Creams that can be used on the Vulva Area V [employer], walmart Vital V [place] Salve Julva- [employer] Botanical Pro-Meno [place] Cream Coconut oil, olive oil Cleo by [employer] labial moisturizer -Amazon,  [place] Releveum ( lidocaine) or Desert [employer] Yes Moisturizer

## 2023-01-26 NOTE — Progress Notes (Signed)
Please notify the physical therapist that this patient changed GI physicians to [facility] in 2019.

## 2023-01-26 NOTE — Therapy (Addendum)
 OUTPATIENT PHYSICAL THERAPY FEMALE PELVIC TREATMENT/ later day discharge   Patient Name: Rebecca Hart MRN: [id number] DOB:[birth year], 67 y.o., female Today's Date: 01/26/2023  END OF SESSION:  PT End of Session - 01/26/23 1552     Visit Number 5    PT Start Time 1535    PT Stop Time 1550    PT Time Calculation (min) 15 min    Activity Tolerance No increased pain    Behavior During Therapy WFL for tasks assessed/performed                 Past Medical History:  Diagnosis Date   Collagenous colitis 02/25/2016   Depression    IBS (irritable bowel syndrome)    No past surgical history on file. Patient Active Problem List   Diagnosis Date Noted   Arthritis of carpometacarpal [facility]) joint of right thumb 10/14/2016   Insomnia 08/29/2013   Allergic rhinitis 08/03/2007   Depression 06/01/2007    PCP: Roderick Pee, MD PCP - General  REFERRING PROVIDER: Cyril Mourning, FNP Ref Provider  REFERRING DIAG: N94.10 (ICD-10-CM) - Unspecified dyspareunia N39.3 (ICD-10-CM) - Stress incontinence (female) (female)  THERAPY DIAG:  Other lack of coordination  Rationale for Evaluation and Treatment: Rehabilitation  ONSET DATE: SUI since 2009 and dyspareunia since 2021  SUBJECTIVE:                                                                                                                                                                                           SUBJECTIVE STATEMENT: Pt reports that she is doing very good, she was able to have sex with her husband after years of not being able. She is very happy. Incontinence is improving and she is thinking about core and pelvic floor exercises when she is working out. Reported that dry needing made a huge difference. She would like to know more about lubricants.     Pt reports that she is stretching every day. Felt good after needling.  Is able to use her vibrator, that she has never been able to use ( similar  to IR size 8) Uses a lubricant. Still burns It has been 2 years since she had pain free sex. She uses slippery stuff, Revaree, SUI- is doing her exercises.  It has not been quite as bad, it will just take time. Reports that she takes a lot of water. Trained herself to go to the bathroom. Husband has been out of town for [employer] work , home only 2 days/ week. He is back now.     Pt reports that exercises are going well.  Pt reports  that she has not done dry needling before but wants to try it for her pelvic floor today.   Pt report that she was not able to do perineal massage last week, but she will.  She does weight training on Tuesdays with her husband.     Pt reports that she has 2 concerns- SUI and pain with sex. SUI for 15 years, sex 3 years ago- not dry. On the outer ring. She is not sure if she is in the right place. Never has had PT before. Used to work as an Museum/gallery exhibitions officer, retired now. Lives in [place] 5 minutes away     Fluid intake: Yes: a lot of water    PAIN:  Are you having pain? No  PRECAUTIONS: None  RED FLAGS: None   WEIGHT BEARING RESTRICTIONS: No  FALLS:  Has patient fallen in last 6 months? No  LIVING ENVIRONMENT: Lives with: lives with their spouse Lives in: House/apartment Stairs: No Has following equipment at home: None  OCCUPATION: retired  PLOF: Independent  PATIENT GOALS: reduce pain with intercourse and SUI  PERTINENT HISTORY:   Sexual abuse: No  BOWEL MOVEMENT:- Pain with bowel movement: No Type of bowel movement:Type (Bristol Stool Scale) 1 without metamucil Fully empty rectum: Yes:   Leakage: No Pads: No Fiber supplement: Yes:    URINATION: Pain with urination: No Fully empty bladder: Yes:   Stream: Strong Urgency: No Frequency: yes depends on water intake Leakage: Coughing, Sneezing, Laughing, and Exercise Pads: Yes: -1 /day  INTERCOURSE: Pain with intercourse: Initial Penetration Ability to have vaginal penetration:  Yes:    Climax: no Marinoff Scale: 3/3  PREGNANCY: Vaginal deliveries 1 Tearing Yes:   C-section deliveries 0 Currently pregnant No  PROLAPSE: None   OBJECTIVE:  Note: Objective measures were completed at Evaluation unless otherwise noted.      COGNITION: Overall cognitive status: Within functional limits for tasks assessed     SENSATION: Light touch: Appears intact Proprioception: Appears intact  MUSCLE LENGTH: Hamstrings: Right 90 deg; Left 90 deg  LUMBAR SPECIAL TESTS:  Single leg stance test: Positive- unstable, weakness noted, dips bilat   POSTURE: rounded shoulders, lifts shoulders with breathing- unable to relax, needs VC's, TC's for DB , scoliosis    LUMBARAROM/PROM:  A/PROM A/PROM  eval  Flexion Within functional limitations   Extension   Right lateral flexion Within functional limitations   Left lateral flexion Within functional limitations   Right rotation   Left rotation    (Blank rows = not tested)  LOWER EXTREMITY ROM:  Active ROM Right eval Left eval  Hip flexion [facility]  Hip extension    Hip abduction    Hip adduction    Hip internal rotation 75% 75%  Hip external rotation 75% 75%  Knee flexion    Knee extension    Ankle dorsiflexion    Ankle plantarflexion    Ankle inversion    Ankle eversion     (Blank rows = not tested)  LOWER EXTREMITY MMT:  MMT Right eval Left eval  Hip flexion 4/5 4/5  Hip extension    Hip abduction 4/5 4/5  Hip adduction 4/5 4/5  Hip internal rotation    Hip external rotation    Knee flexion    Knee extension    Ankle dorsiflexion    Ankle plantarflexion    Ankle inversion    Ankle eversion     PALPATION:   General  within functional limitations  External Perineal Exam some redness noticed                             Internal Pelvic Floor tight and tender perineal scar, vaginal laxity noticed with some tissue thinning and tight and tender bulbocavernosus bilateral, more on  L  Patient confirms identification and approves PT to assess internal pelvic floor and treatment Yes  PELVIC MMT:   MMT eval  Vaginal 4/5  Internal Anal Sphincter   External Anal Sphincter   Puborectalis   Diastasis Recti none  (Blank rows = not tested)        TONE: low tone abdomen   PROLAPSE: no  TODAY'S TREATMENT:                                                                                                                              DATE: 01/26/23      There act- education on lubricants, discussed progress, SUI, HEP       Therapeutic exercise- bridging with TRA breath, SLR  Therapeutic activity- review of progress, relevant anatomy, lubricants, dilators/ vibrators use   EVAL see above Manual- trial of pelvic floor dry needling, manual pelvic floor release        Neuro reed- flys with #7 with TRANSVERSE ABDOMINIS BREATH  Hip add with ball with TRANSVERSE ABDOMINIS BREATH Rowing with TRANSVERSE ABDOMINIS BREATH  TRANSVERSE ABDOMINIS BREATH on noodle with cough      PATIENT EDUCATION:  Education details: perineal massage, vaginal lubricants and moisturizers, pelvic floor downtraining, DB, HEP Person educated: Patient Education method: Explanation, Demonstration, Tactile cues, Verbal cues, and Handouts Education comprehension: verbalized understanding and needs further education  HOME EXERCISE PROGRAM:  [id number]  ASSESSMENT: Pt has done great in PT, has been able to have intercourse again with her husband with minimal pain and will be discharged from PT. Discussed coming back to PT to reduce SUI PRN in the future.    Pt seems to be progressing well, using her vibrator, progressing with her stretches. SUI still the same. Her exercise focus has been on dyspareunia. Feels like she has the tools, will try intercourse with husband who has been out of town and see if she needs more DN. Has some right hip weakness and tightness but no pain.    Tx  focus- pelvic floor dry needling and manual release. Pt able to [facility] good pelvic floor contraction and lengthening now, learning what AROM PF feels like. Minimal prolapse in standing noticed. Did great with trial of needling and reported immediate relief.  Trigger Point Dry-Needling  Treatment instructions: Expect mild to moderate muscle soreness. S/S of pneumothorax if dry needled over a lung field, and to seek immediate medical attention should they occur. Patient verbalized understanding of these instructions and education.  Patient Consent Given: Yes Education handout provided: Yes Muscles treated: perineal body and bulbo bilat Treatment response/outcome: Utilized skilled palpation to  identify trigger points.  During dry needling able to palpate muscle twitch and muscle elongation   Skilled palpation and monitoring by PT during dry needling    Pt able to coordinate her breath and pelvic floor and abdomen better today. Discussed goals and consistency with HEP as well as incorporating better breathing strategies in her sessions with trainer.   CLINICAL IMPRESSION: Patient is a 67 y.o. F who was seen today for physical therapy evaluation and treatment for dyspareunia and stress urinary incontinence.She presents with tight and tender perineal scar and superficial pelvic floor muscles. Some vaginal atrophy noticed. Pt also with some bilateral hip and core weakness and she will benefit from PT to improve core strength to reduce SUI.  OBJECTIVE IMPAIRMENTS: decreased coordination, decreased mobility, decreased ROM, decreased strength, increased fascial restrictions, increased muscle spasms, and pain.   ACTIVITY LIMITATIONS: toileting  PARTICIPATION LIMITATIONS: interpersonal relationship  PERSONAL FACTORS: Time since onset of injury/illness/exacerbation are also affecting patient's functional outcome.   REHAB POTENTIAL: Good  CLINICAL DECISION MAKING: Stable/uncomplicated  EVALUATION  COMPLEXITY: Low   GOALS: Goals reviewed with patient? Yes  SHORT TERM GOALS: Target date: 01/28/2023    Pt will report reduced pain with perineal massage to max 3/10 Baseline: Goal status: 4/10 with large dilator  2.  Pt will be ind with using dilators for reducing pelvic floor tension and pain management  Baseline:  Goal status: met  3.  Pt will demonstrate increase in all impaired hip strength by 1 muscle grades in order to demonstrate improved lumbopelvic support and increase functional ability.   Baseline:  Goal status: met  4.  Pt will be independent with HEP.   Baseline:  Goal status: met  LONG TERM GOALS: Target date: 02/25/2023    Pt will report 0/10 pain with vaginal penetration in order to improve intimate relationship with partner.    Baseline:  Goal status: progressing  2.  Pt will soak 0 pads/ day Baseline:  Goal status: not met  3.  Pt to be I with advanced HEP.  Baseline:  Goal status: met   PLAN:    PLAN FOR NEXT SESSION: discharge PT  Denzal Meir, PT 01/26/23 3:53 PM    PHYSICAL THERAPY DISCHARGE SUMMARY   Patient agrees to discharge. Patient goals were partially met. Patient is being discharged due to being pleased with the current functional level.

## 2023-02-02 ENCOUNTER — Encounter: Payer: Self-pay | Admitting: Physical Therapy

## 2023-02-09 ENCOUNTER — Encounter: Payer: Self-pay | Admitting: Physical Therapy

## 2023-03-11 ENCOUNTER — Encounter: Payer: Medicare Other | Admitting: Physical Therapy

## 2023-03-22 ENCOUNTER — Encounter: Payer: Medicare Other | Admitting: Physical Therapy

## 2023-03-29 ENCOUNTER — Encounter: Payer: Medicare Other | Admitting: Physical Therapy

## 2023-04-05 ENCOUNTER — Encounter: Payer: Medicare Other | Admitting: Physical Therapy

## 2023-11-05 HISTORY — PX: LIPOSUCTION: SHX10

## 2024-02-24 ENCOUNTER — Encounter (HOSPITAL_BASED_OUTPATIENT_CLINIC_OR_DEPARTMENT_OTHER): Admitting: Obstetrics and Gynecology

## 2024-03-06 ENCOUNTER — Encounter: Payer: Self-pay | Admitting: Physician Assistant

## 2024-03-07 ENCOUNTER — Other Ambulatory Visit (HOSPITAL_COMMUNITY): Payer: Self-pay

## 2024-03-07 ENCOUNTER — Encounter (HOSPITAL_BASED_OUTPATIENT_CLINIC_OR_DEPARTMENT_OTHER): Payer: Self-pay | Admitting: Certified Nurse Midwife

## 2024-03-07 ENCOUNTER — Other Ambulatory Visit (HOSPITAL_COMMUNITY)
Admission: RE | Admit: 2024-03-07 | Discharge: 2024-03-07 | Disposition: A | Discharge status: Home / Self Care | Source: Ambulatory Visit | Attending: Certified Nurse Midwife | Admitting: Certified Nurse Midwife

## 2024-03-07 ENCOUNTER — Ambulatory Visit (HOSPITAL_BASED_OUTPATIENT_CLINIC_OR_DEPARTMENT_OTHER): Admitting: Certified Nurse Midwife

## 2024-03-07 VITALS — BP 114/60 | HR 50 | Ht 67.0 in | Wt 166.4 lb

## 2024-03-07 DIAGNOSIS — Z124 Encounter for screening for malignant neoplasm of cervix: Secondary | ICD-10-CM | POA: Diagnosis not present

## 2024-03-07 DIAGNOSIS — Z1331 Encounter for screening for depression: Secondary | ICD-10-CM | POA: Diagnosis not present

## 2024-03-07 DIAGNOSIS — N952 Postmenopausal atrophic vaginitis: Secondary | ICD-10-CM | POA: Diagnosis not present

## 2024-03-07 DIAGNOSIS — N393 Stress incontinence (female) (male): Secondary | ICD-10-CM

## 2024-03-07 MED ORDER — ESTRADIOL 0.01 % VA CREA
TOPICAL_CREAM | VAGINAL | 6 refills | Status: AC
  Filled 2024-03-07: qty 42.5, 90d supply, fill #0

## 2024-03-07 NOTE — Progress Notes (Signed)
" ° ° ° °  GYNECOLOGY  VISIT  CC:   New Patient (Initial Visit) (Dicuss vaginitis/Painful sex)   HPI: 68 y.o. [id number] Married Other or two or more races female here for new problem focused gyn visit. Her last pap smear was ?2018. Pt reports pain with sex. States the pain occurs with insertion, pain seems to be around vaginal introitus. She uses Revaree vaginal moisturizers routinely. Menopause was ~ 16 years ago. Pt was on PremPro for ~ 2 years but no hormone or estrogen therapy in over 14 years. She also reports urinary stress incontinence.  Patient's last menstrual period was 02/07/2010.  Past Medical History:  Diagnosis Date   Collagenous colitis 02/25/2016   Depression    IBS (irritable bowel syndrome)     MEDS:  Reviewed in EPIC  ALLERGIES: Patient has no known allergies.  SH:  Lives w/ spouse  Review of Systems  Constitutional: Negative.   Genitourinary:        +urinary stress incontinence    PHYSICAL EXAMINATION:    BP 114/60   Pulse (!) 50   Ht 5' 7 (1.702 m)   Wt 166 lb 6.4 oz (75.5 kg)   LMP 02/07/2010   BMI 26.06 kg/m     General appearance: alert, cooperative and appears stated age   Pelvic: External genitalia:  atrophic, slightly narrow vaginal introitus, no lesions              Urethra:  normal appearing urethra with no masses, tenderness or lesions              Bartholins and Skenes: normal                 Vagina: atrophic mucosa              Cervix: multiparous appearance, no bleeding following Pap, no cervical motion tenderness, and no lesions              Bimanual Exam:  Uterus:  normal size, contour, position, consistency, mobility, non-tender              Adnexa: no mass, fullness, tenderness             Anus:  normal sphincter tone, no lesions  Chaperone was present for exam.  Assessment/Plan: 1. Cervical cancer screening (Primary) - Pt agreeable to routine pap smear for cervical cancer screening - Per guidelines if pap smear  Negative/Negative, no further pap smears will be indicated - Cytology - PAP( [facility])  2. Vaginal atrophy - Pt agreeable to a trial of vaginal estrogen. - estradiol (ESTRACE) 0.01 % CREA vaginal cream; Place 1 gram vaginally 2 (two) times daily for 14 days, THEN 2 grams 2 (two) times a week.  Dispense: 42.5 g; Refill: 6 - Pt is aware of availability of Pelvic Floor Physical Therapy.  3. Urinary stress incontinence - Discussed referral to UroGyn available if desired - Discussed Pelvic Floor PT if desired  RTO in 1 year for annual gynecological exam and prn if issues arise prior. Arland POUR Daquawn Seelman    "

## 2024-03-10 LAB — CYTOLOGY - PAP
Comment: NEGATIVE
Diagnosis: NEGATIVE
High risk HPV: NEGATIVE

## 2024-03-13 ENCOUNTER — Ambulatory Visit (HOSPITAL_BASED_OUTPATIENT_CLINIC_OR_DEPARTMENT_OTHER): Payer: Self-pay | Admitting: Certified Nurse Midwife

## 2024-04-03 ENCOUNTER — Ambulatory Visit: Admitting: Physician Assistant

## 2024-04-21 ENCOUNTER — Ambulatory Visit: Admitting: Gastroenterology
